# Patient Record
Sex: Male | Born: 1965 | Race: Black or African American | Hispanic: No | Marital: Married | Smoking: Never smoker
Health system: Southern US, Community
[De-identification: ages and names within clinical notes are randomized; demographics above are authoritative.]

## PROBLEM LIST (undated history)

## (undated) DIAGNOSIS — I10 Essential (primary) hypertension: Secondary | ICD-10-CM

## (undated) DIAGNOSIS — Z789 Other specified health status: Secondary | ICD-10-CM

## (undated) HISTORY — DX: Other specified health status: Z78.9

## (undated) HISTORY — DX: Essential (primary) hypertension: I10

## (undated) HISTORY — PX: NO PAST SURGERIES: SHX2092

---

## 2001-11-12 ENCOUNTER — Emergency Department (HOSPITAL_COMMUNITY): Admission: EM | Admit: 2001-11-12 | Discharge: 2001-11-12 | Payer: Self-pay | Admitting: Emergency Medicine

## 2002-06-26 ENCOUNTER — Encounter: Payer: Self-pay | Admitting: Endocrinology

## 2002-06-26 ENCOUNTER — Ambulatory Visit (HOSPITAL_COMMUNITY): Admission: RE | Admit: 2002-06-26 | Discharge: 2002-06-26 | Payer: Self-pay | Admitting: Endocrinology

## 2003-10-31 ENCOUNTER — Emergency Department (HOSPITAL_COMMUNITY): Admission: EM | Admit: 2003-10-31 | Discharge: 2003-10-31 | Payer: Self-pay | Admitting: Emergency Medicine

## 2004-08-20 ENCOUNTER — Emergency Department (HOSPITAL_COMMUNITY): Admission: EM | Admit: 2004-08-20 | Discharge: 2004-08-20 | Payer: Self-pay | Admitting: Emergency Medicine

## 2004-09-08 ENCOUNTER — Ambulatory Visit: Payer: Self-pay | Admitting: Internal Medicine

## 2005-08-19 ENCOUNTER — Emergency Department (HOSPITAL_COMMUNITY): Admission: EM | Admit: 2005-08-19 | Discharge: 2005-08-19 | Payer: Self-pay | Admitting: Emergency Medicine

## 2005-09-03 ENCOUNTER — Ambulatory Visit: Payer: Self-pay | Admitting: Internal Medicine

## 2010-12-29 ENCOUNTER — Other Ambulatory Visit: Payer: Self-pay | Admitting: Specialist

## 2010-12-29 ENCOUNTER — Ambulatory Visit
Admission: RE | Admit: 2010-12-29 | Discharge: 2010-12-29 | Disposition: A | Discharge status: Home / Self Care | Payer: No Typology Code available for payment source | Source: Ambulatory Visit | Attending: Specialist | Admitting: Specialist

## 2010-12-29 DIAGNOSIS — R6889 Other general symptoms and signs: Secondary | ICD-10-CM

## 2010-12-29 DIAGNOSIS — R7612 Nonspecific reaction to cell mediated immunity measurement of gamma interferon antigen response without active tuberculosis: Secondary | ICD-10-CM

## 2012-05-18 ENCOUNTER — Emergency Department (HOSPITAL_COMMUNITY)
Admission: EM | Admit: 2012-05-18 | Discharge: 2012-05-18 | Disposition: A | Discharge status: Home / Self Care | Payer: Self-pay | Attending: Emergency Medicine | Admitting: Emergency Medicine

## 2012-05-18 ENCOUNTER — Encounter (HOSPITAL_COMMUNITY): Payer: Self-pay | Admitting: *Deleted

## 2012-05-18 ENCOUNTER — Emergency Department (HOSPITAL_COMMUNITY): Payer: Self-pay

## 2012-05-18 DIAGNOSIS — R0989 Other specified symptoms and signs involving the circulatory and respiratory systems: Secondary | ICD-10-CM | POA: Insufficient documentation

## 2012-05-18 DIAGNOSIS — R002 Palpitations: Secondary | ICD-10-CM | POA: Insufficient documentation

## 2012-05-18 DIAGNOSIS — R0602 Shortness of breath: Secondary | ICD-10-CM | POA: Insufficient documentation

## 2012-05-18 DIAGNOSIS — R0609 Other forms of dyspnea: Secondary | ICD-10-CM | POA: Insufficient documentation

## 2012-05-18 DIAGNOSIS — R42 Dizziness and giddiness: Secondary | ICD-10-CM | POA: Insufficient documentation

## 2012-05-18 DIAGNOSIS — R079 Chest pain, unspecified: Secondary | ICD-10-CM

## 2012-05-18 LAB — POCT I-STAT TROPONIN I: Troponin i, poc: 0 ng/mL (ref 0.00–0.08)

## 2012-05-18 LAB — BASIC METABOLIC PANEL
Calcium: 9.2 mg/dL (ref 8.4–10.5)
Creatinine, Ser: 0.85 mg/dL (ref 0.50–1.35)
GFR calc non Af Amer: >90 mL/min (ref >90)
Glucose, Bld: 111 mg/dL — ABNORMAL HIGH (ref 70–99)
Sodium: 138 mEq/L (ref 135–145)

## 2012-05-18 LAB — CBC
MCH: 29.1 pg (ref 26.0–34.0)
MCHC: 36.6 g/dL — ABNORMAL HIGH (ref 30.0–36.0)
MCV: 79.6 fL (ref 78.0–100.0)
Platelets: 200 10*3/uL (ref 150–400)

## 2012-05-18 MED ORDER — ASPIRIN 81 MG PO CHEW
162.0000 mg | CHEWABLE_TABLET | Freq: Once | ORAL | Status: AC
  Administered 2012-05-18: 162 mg via ORAL
  Filled 2012-05-18: qty 2

## 2012-05-18 NOTE — ED Notes (Signed)
Patient to xray.

## 2012-05-18 NOTE — ED Provider Notes (Signed)
History     CSN: [id number]  Arrival date & time 05/18/12  0402   First MD Initiated Contact with Patient 05/18/12 0421      Chief Complaint  Patient presents with  . Chest Pain    (Consider location/radiation/quality/duration/timing/severity/associated sxs/prior treatment) HPI Comments: [name] with no PMH presents to the ED with complaints of chest pain. Pt states the pain was 2 sharp jolts of pain that woke him up at 2:30AM this morning. He denies any radiation. He admits to palpitations at night, some SOB and difficulty breathing, and ooccasional dizziness upon waking. On ROS, pt denies fever, chills, HAs, recent wt. loss, N/V, dyspepsia, abd. pain decreased appetite. He has no PMH. Denies alcohol/tobacco/drug use.  Patient is a 47 y.o. male presenting with chest pain. The history is provided by the patient.  Chest Pain Associated symptoms: no abdominal pain, no cough and no shortness of breath     History reviewed. No pertinent past medical history.  History reviewed. No pertinent past surgical history.  History reviewed. No pertinent family history.  History  Substance Use Topics  . Smoking status: Never Smoker   . Smokeless tobacco: Not on file  . Alcohol Use: No      Review of Systems  Constitutional: Negative for activity change and appetite change.  Respiratory: Negative for cough and shortness of breath.   Cardiovascular: Positive for chest pain.  Gastrointestinal: Negative for abdominal pain.  Genitourinary: Negative for dysuria.    Allergies  Review of patient's allergies indicates no known allergies.  Home Medications  No current outpatient prescriptions on file.  BP 132/88  Pulse 55  Temp(Src) 99.4 F (37.4 C) (Oral)  Resp 16  SpO2 100%  Physical Exam  Constitutional: He is oriented to person, place, and time. He appears well-developed.  HENT:  Head: Normocephalic and atraumatic.  Eyes: Conjunctivae and EOM are normal. Pupils are  equal, round, and reactive to light.  Neck: Normal range of motion. Neck supple.  Cardiovascular: Normal rate and regular rhythm.   Pulmonary/Chest: Effort normal and breath sounds normal. No respiratory distress. He has no wheezes. He has no rales.  Abdominal: Soft. Bowel sounds are normal. He exhibits no distension. There is no tenderness. There is no rebound and no guarding.  Neurological: He is alert and oriented to person, place, and time.  Skin: Skin is warm.    ED Course  Procedures (including critical care time)  Labs Reviewed  CBC - Abnormal; Notable for the following:    MCHC 36.6 (*)    All other components within normal limits  BASIC METABOLIC PANEL - Abnormal; Notable for the following:    Potassium 3.3 (*)    Glucose, Bld 111 (*)    All other components within normal limits  POCT I-STAT TROPONIN I   Dg Chest 2 View  05/18/2012  *RADIOLOGY REPORT*  Clinical Data: Intermittent shortness of breath and epigastric discomfort.  CHEST - 2 VIEW  Comparison: Chest radiograph performed 12/29/2010  Findings: The lungs are well-aerated.  Minimal bibasilar opacities may reflect atelectasis or less likely mild pneumonia, slightly more prominent on the right than on prior studies.  No pleural effusion or pneumothorax is seen.  The heart is normal in size; the mediastinal contour is within normal limits.  No acute osseous abnormalities are seen.  IMPRESSION: Minimal bibasilar opacities may reflect atelectasis or less likely mild pneumonia, slightly more prominent on the right than on prior studies.   Original Report Authenticated  By: Tonia Ghent, M.D.      No diagnosis found.    MDM   Date: 05/18/2012  Rate: 74  Rhythm: normal sinus rhythm  QRS Axis: normal  Intervals: normal  ST/T Wave abnormalities: normal  Conduction Disutrbances: none  Narrative Interpretation: unremarkable  Differential diagnosis includes: ACS syndrome CHF exacerbation Valvular  disorder Myocarditis Pericarditis Pericardial effusion Pneumonia Pleural effusion Pulmonary edema PE Anemia Musculoskeletal pain  Pt comes in with atypical chest pain. Pt has been having similar pains over the past 3 months, about 3 episodes so far. He has no cardiac risk factors. Pain is not pleuritic, exertional, musculoskeletal. Will get 2 trops as an ED rule out. Return precautions discussed.    Derwood Kaplan, MD 05/18/12 [phone]

## 2012-05-18 NOTE — ED Notes (Signed)
Pt c/o intermittent "feeling in my chest", pointing to epigastric area.  Also c/o intermittent SOB.  Denies n/v, weakness, dizziness, fevers.  States "It happens after I eat oil"

## 2012-05-18 NOTE — ED Notes (Signed)
Patient returned from xray.

## 2012-05-18 NOTE — ED Notes (Signed)
Patient states, "I have no chest pain.  But sometimes at night when I have eaten something with oil, I hear it.  It feels fluttery.   But no pain".

## 2015-05-28 LAB — GLUCOSE, POCT (MANUAL RESULT ENTRY): POC GLUCOSE: 73 mg/dL (ref 70–99)

## 2017-03-10 ENCOUNTER — Encounter (HOSPITAL_COMMUNITY): Payer: Self-pay | Admitting: Family Medicine

## 2017-03-10 ENCOUNTER — Ambulatory Visit (INDEPENDENT_AMBULATORY_CARE_PROVIDER_SITE_OTHER): Payer: Self-pay

## 2017-03-10 ENCOUNTER — Ambulatory Visit (HOSPITAL_COMMUNITY)
Admission: EM | Admit: 2017-03-10 | Discharge: 2017-03-10 | Disposition: A | Discharge status: Home / Self Care | Payer: Self-pay | Attending: Family Medicine | Admitting: Family Medicine

## 2017-03-10 DIAGNOSIS — M79601 Pain in right arm: Secondary | ICD-10-CM

## 2017-03-10 DIAGNOSIS — S5002XA Contusion of left elbow, initial encounter: Secondary | ICD-10-CM

## 2017-03-10 MED ORDER — DICLOFENAC SODIUM 1 % TD GEL: 2.0000 g | Freq: Two times a day (BID) | TRANSDERMAL | 0 refills | Status: AC | PRN

## 2017-03-10 MED ORDER — NAPROXEN 500 MG PO TABS: 500.0000 mg | ORAL_TABLET | Freq: Two times a day (BID) | ORAL | 0 refills | Status: DC

## 2017-03-10 NOTE — ED Triage Notes (Signed)
Pt here for left elbow pain for 2 weeks. Reports that he hit it on the wall. Using OTC meds without relief.

## 2017-03-10 NOTE — ED Provider Notes (Signed)
[facility]    CSN: [id number] Arrival date & time: 03/10/17  1344     History   Chief Complaint Chief Complaint  Patient presents with  . Arm Pain    HPI Jonathan Lyons is a 52 y.o. male.   52 year-old male, presenting today complaining of left elbow pain.  States that he hit his left lateral elbow on a wall about 2 weeks ago.  Has been using topical medications at home without much relief.  Pain with flexion of the elbow as well as pronation and supination.  Patient is right-handed   The history is provided by the patient.  Arm Pain  This is a new problem. The current episode started more than 1 week ago. The problem occurs constantly. The problem has not changed since onset.Pertinent negatives include no chest pain, no abdominal pain, no headaches and no shortness of breath. The symptoms are aggravated by twisting (movement of the left elbow). Nothing relieves the symptoms. He has tried nothing for the symptoms. The treatment provided no relief.    History reviewed. No pertinent past medical history.  There are no active problems to display for this patient.   History reviewed. No pertinent surgical history.     Home Medications    Prior to Admission medications   Medication Sig Start Date End Date Taking? Authorizing Provider  diclofenac sodium (VOLTAREN) 1 % GEL Apply 2 g topically 2 (two) times daily as needed for up to 5 days (elbow pain). 03/10/17 03/15/17  Blue, Olivia C, PA-C  naproxen (NAPROSYN) 500 MG tablet Take 1 tablet (500 mg total) by mouth 2 (two) times daily. 03/10/17   Blue, Marylene Land, PA-C    Family History History reviewed. No pertinent family history.  Social History Social History   Tobacco Use  . Smoking status: Never Smoker  Substance Use Topics  . Alcohol use: No  . Drug use: No     Allergies   Patient has no known allergies.   Review of Systems Review of Systems  Constitutional: Negative for chills and fever.  HENT:  Negative for ear pain and sore throat.   Eyes: Negative for pain and visual disturbance.  Respiratory: Negative for cough and shortness of breath.   Cardiovascular: Negative for chest pain and palpitations.  Gastrointestinal: Negative for abdominal pain and vomiting.  Genitourinary: Negative for dysuria and hematuria.  Musculoskeletal: Positive for joint swelling (left elbow ). Negative for arthralgias and back pain.  Skin: Negative for color change and rash.  Neurological: Negative for seizures, syncope and headaches.  All other systems reviewed and are negative.    Physical Exam Triage Vital Signs ED Triage Vitals [03/10/17 1518]  Enc Vitals Group     BP 135/85     Pulse Rate (!) 53     Resp 18     Temp 97.8 F (36.6 C)     Temp src      SpO2 100 %     Weight      Height      Head Circumference      Peak Flow      Pain Score 7     Pain Loc      Pain Edu?      Excl. in GC?    No data found.  Updated Vital Signs BP 135/85   Pulse (!) 53   Temp 97.8 F (36.6 C)   Resp 18   SpO2 100%   Visual Acuity Right Eye  Distance:   Left Eye Distance:   Bilateral Distance:    Right Eye Near:   Left Eye Near:    Bilateral Near:     Physical Exam  Constitutional: He appears well-developed and well-nourished.  HENT:  Head: Normocephalic and atraumatic.  Eyes: Conjunctivae are normal.  Neck: Neck supple.  Cardiovascular: Normal rate and regular rhythm.  No murmur heard. Pulmonary/Chest: Effort normal and breath sounds normal. No respiratory distress.  Abdominal: Soft. There is no tenderness.  Musculoskeletal: He exhibits no edema.       Left elbow: Tenderness found. Lateral epicondyle tenderness noted.  Neurological: He is alert.  Skin: Skin is warm and dry.  Psychiatric: He has a normal mood and affect.  Nursing note and vitals reviewed.    UC Treatments / Results  Labs (all labs ordered are listed, but only abnormal results are displayed) Labs Reviewed - No  data to display  EKG  EKG Interpretation None       Radiology Dg Elbow Complete Left  Result Date: 03/10/2017 CLINICAL DATA:  Status post trauma 2 weeks ago with pain of the left elbow. EXAM: LEFT ELBOW - COMPLETE 3+ VIEW COMPARISON:  None. FINDINGS: There is no evidence of fracture, dislocation, or joint effusion. Soft tissues are unremarkable. IMPRESSION: No acute fracture or dislocation. Electronically Signed   By: Sherian ReinWei-Chen  Lin M.D.   On: 03/10/2017 15:45    Procedures Procedures (including critical care time)  Medications Ordered in UC Medications - No data to display   Initial Impression / Assessment and Plan / UC Course  I have reviewed the triage vital signs and the nursing notes.  Pertinent labs & imaging results that were available during my care of the patient were reviewed by me and considered in my medical decision making (see chart for details).     X-ray.  Patient likely has a contusion with secondary inflammation.  Given anti-inflammatory as well as Voltaren gel  Final Clinical Impressions(s) / UC Diagnoses   Final diagnoses:  Contusion of left elbow, initial encounter    ED Discharge Orders        Ordered    naproxen (NAPROSYN) 500 MG tablet  2 times daily     03/10/17 1555    diclofenac sodium (VOLTAREN) 1 % GEL  2 times daily PRN     03/10/17 1555       Controlled Substance Prescriptions [place] Controlled Substance Registry consulted? Not Applicable   Alecia LemmingBlue, Olivia C, [place]PA-C 03/10/17 1701

## 2017-07-31 ENCOUNTER — Ambulatory Visit (HOSPITAL_COMMUNITY)
Admission: EM | Admit: 2017-07-31 | Discharge: 2017-07-31 | Disposition: A | Discharge status: Home / Self Care | Payer: Self-pay | Attending: Family Medicine | Admitting: Family Medicine

## 2017-07-31 ENCOUNTER — Encounter (HOSPITAL_COMMUNITY): Payer: Self-pay | Admitting: Emergency Medicine

## 2017-07-31 DIAGNOSIS — R600 Localized edema: Secondary | ICD-10-CM

## 2017-07-31 DIAGNOSIS — I10 Essential (primary) hypertension: Secondary | ICD-10-CM

## 2017-07-31 MED ORDER — HYDROCHLOROTHIAZIDE 25 MG PO TABS: 25.0000 mg | ORAL_TABLET | Freq: Every day | ORAL | 0 refills | Status: DC

## 2017-07-31 NOTE — ED Triage Notes (Signed)
Pt c/o LLQ abdomianl pain and feet swelling

## 2017-07-31 NOTE — ED Provider Notes (Signed)
[facility]    CSN: [id number] Arrival date & time: 07/31/17  1857     History   Chief Complaint Chief Complaint  Patient presents with  . Abdominal Pain  . Foot Swelling    HPI Jonathan Lyons is a 52 y.o. male.   HPI  Patient does not have a regular primary care doctor.  He does not get any regular medical care.  He states that he has heartburn and takes Zantac over-the-counter on a regular basis.  Certain foods bother him more than others.  He states that a couple days ago he had some left lower quadrant abdominal pain.  He states that it was significantly bothering him, and then went away.  He thought that it was from "bad food".  [place]  His English is good.  The main reason he came in today is that he has ankle swelling.  Both of his ankles have been swollen for the last 2 days.  No chest pain.  He states he has shortness of breath "when I run".  He states this is unchanged.  He is on a regular diet and states that he does not eat a lot of salt.  No change in his diet.  No over-the-counter medicines other than the Zantac.  History reviewed. No pertinent past medical history.  There are no active problems to display for this patient.   History reviewed. No pertinent surgical history.     Home Medications    Prior to Admission medications   Medication Sig Start Date End Date Taking? Authorizing Provider  hydrochlorothiazide (HYDRODIURIL) 25 MG tablet Take 1 tablet (25 mg total) by mouth daily. 07/31/17   Eustace MooreNelson, Yvonne Sue, MD    Family History No family history on file. Patient specifically states no family history of hypertension or heart disease is known Social History Social History   Tobacco Use  . Smoking status: Never Smoker  Substance Use Topics  . Alcohol use: No  . Drug use: No     Allergies   Patient has no known allergies.   Review of Systems Review of Systems    Constitutional: Negative for chills and fever.  HENT: Negative for ear pain and sore throat.   Eyes: Negative for pain and visual disturbance.  Respiratory: Positive for shortness of breath. Negative for cough.        Sometimes with exercise  Cardiovascular: Positive for leg swelling. Negative for chest pain and palpitations.  Gastrointestinal: Negative for abdominal pain and vomiting.  Genitourinary: Negative for dysuria and hematuria.  Musculoskeletal: Negative for arthralgias and back pain.  Skin: Negative for color change and rash.  Neurological: Negative for seizures and syncope.  All other systems reviewed and are negative.    Physical Exam Triage Vital Signs ED Triage Vitals [07/31/17 1940]  Enc Vitals Group     BP (!) 173/86     Pulse Rate 65     Resp 18     Temp 97.8 F (36.6 C)     Temp src      SpO2 99 %     Updated Vital Signs BP (!) 173/86   Pulse 65   Temp 97.8 F (36.6 C)   Resp 18   SpO2 99%   Repeat blood pressure, supine, 154/90    Physical Exam  Constitutional: He is oriented to person, place, and time. He appears well-developed and well-nourished. No distress.  HENT:  Head: Normocephalic and atraumatic.  Mouth/Throat: Oropharynx is clear and moist.  Eyes: Pupils are equal, round, and reactive to light. Conjunctivae are normal.  Disks flat  Neck: Normal range of motion.  Cardiovascular: Normal rate, regular rhythm and normal heart sounds.  Pulmonary/Chest: Effort normal and breath sounds normal. No respiratory distress. He has no rales.  Abdominal: Soft. Bowel sounds are normal. He exhibits no distension. There is no hepatosplenomegaly. There is no tenderness.  Musculoskeletal: Normal range of motion. He exhibits no edema.  Neurological: He is alert and oriented to person, place, and time.  Skin: Skin is warm and dry.  Psychiatric: He has a normal mood and affect. His behavior is normal.     UC Treatments / Results  Labs (all labs  ordered are listed, but only abnormal results are displayed) Labs Reviewed - No data to display  EKG EKG with early LVH.  Possible old septal infarct (doubt).  Discussed with patient  Radiology No results found.  Procedures Procedures (including critical care time)  Medications Ordered in UC Medications - No data to display  Initial Impression / Assessment and Plan / UC Course  I have reviewed the triage vital signs and the nursing notes.  Pertinent labs & imaging results that were available during my care of the patient were reviewed by me and considered in my medical decision making (see chart for details).     Patient has untreated hypertension for, unclear period of time.  He does have some early LVH.  This may be contributing to his pedal edema.  We discussed blood pressure management, follow-up with the PCP.  Avoiding salt. He should go to the emergency room if he develops chest pain, dyspnea on exertion, increasing edema Final Clinical Impressions(s) / UC Diagnoses   Final diagnoses:  Pedal edema  Essential hypertension     Discharge Instructions     Take your blood pressure pill every day. Limit salty food in your diet. Elevate your feet when they are swollen. Follow-up with a primary care doctor. Follow the instructions on your paperwork to obtain up assistance through [facility]     ED Prescriptions    Medication Sig Dispense Auth. Provider   hydrochlorothiazide (HYDRODIURIL) 25 MG tablet Take 1 tablet (25 mg total) by mouth daily. 90 tablet Eustace Moore, MD     Controlled Substance Prescriptions [place] Controlled Substance Registry consulted? Not Applicable   Eustace Moore, MD 07/31/17 2123

## 2017-07-31 NOTE — Discharge Instructions (Addendum)
Take your blood pressure pill every day. Limit salty food in your diet. Elevate your feet when they are swollen. Follow-up with a primary care doctor. Follow the instructions on your paperwork to obtain [facility] assistance [facility]

## 2017-09-12 ENCOUNTER — Ambulatory Visit (HOSPITAL_COMMUNITY)
Admission: EM | Admit: 2017-09-12 | Discharge: 2017-09-12 | Disposition: A | Discharge status: Home / Self Care | Payer: Self-pay | Attending: Family Medicine | Admitting: Family Medicine

## 2017-09-12 ENCOUNTER — Encounter (HOSPITAL_COMMUNITY): Payer: Self-pay | Admitting: Emergency Medicine

## 2017-09-12 DIAGNOSIS — I10 Essential (primary) hypertension: Secondary | ICD-10-CM | POA: Diagnosis present

## 2017-09-12 DIAGNOSIS — E876 Hypokalemia: Secondary | ICD-10-CM

## 2017-09-12 HISTORY — DX: Essential (primary) hypertension: I10

## 2017-09-12 LAB — POCT I-STAT, CHEM 8
BUN: 17 mg/dL (ref 6–20)
Calcium, Ion: 1.16 mmol/L (ref 1.15–1.40)
Chloride: 100 mmol/L (ref 98–111)
Creatinine, Ser: 1 mg/dL (ref 0.61–1.24)
Glucose, Bld: 95 mg/dL (ref 70–99)
HCT: 42 % (ref 39.0–52.0)
Hemoglobin: 14.3 g/dL (ref 13.0–17.0)
Potassium: 3.3 mmol/L — ABNORMAL LOW (ref 3.5–5.1)
Sodium: 142 mmol/L (ref 135–145)
TCO2: 28 mmol/L (ref 22–32)

## 2017-09-12 MED ORDER — HYDROCHLOROTHIAZIDE 25 MG PO TABS: 25.0000 mg | ORAL_TABLET | Freq: Every day | ORAL | 0 refills | Status: DC

## 2017-09-12 MED ORDER — POTASSIUM CHLORIDE CRYS ER 20 MEQ PO TBCR: 20.0000 meq | EXTENDED_RELEASE_TABLET | Freq: Every day | ORAL | 0 refills | Status: DC

## 2017-09-12 NOTE — ED Triage Notes (Signed)
Pt sts right arm pain and left leg pain; pt sts some swelling but denies injury

## 2017-09-12 NOTE — Discharge Instructions (Signed)
Your potassium is a little bit low.  This is because of the blood pressure pill.  You should increase the potassium foods in your diet.  You may need to take a potassium supplement.  I have sent a refill of your blood pressure medicine to the pharmacy.  Follow-up with primary care as scheduled

## 2017-09-12 NOTE — ED Provider Notes (Signed)
[facility]    CSN: [id number] Arrival date & time: 09/12/17  1059     History   Chief Complaint Chief Complaint  Patient presents with  . Leg Pain  . Arm Pain    HPI Jonathan Lyons is a 52 y.o. male.   HPI  Patient is here complaining of swelling in his right arm and in his left leg.  He points to his forearm on the right just below the medial epicondyle and mentions that this area looks swollen to him.  With wrist flexion, this tenses with the muscles.  I pointed out to him that it is muscular.  He asks why it is swollen.  I believe it is hypertrophic because he has work activity where he uses his hands and arm.  He states there is a pocket of swelling also on his left lateral thigh.  This is not present today for me to examine.  He has no pedal edema.  He is taking his blood pressure medication daily.  Blood pressure today is well controlled.  He states he needs a refill blood pressure medicine.  He states he has found a PCP to see next week. Denies any pain He denies any chest pain shortness of breath, no headaches or dizzy spells  History reviewed. No pertinent past medical history.  Patient Active Problem List   Diagnosis Date Noted  . Essential hypertension 09/12/2017    History reviewed. No pertinent surgical history.     Home Medications    Prior to Admission medications   Medication Sig Start Date End Date Taking? Authorizing Provider  hydrochlorothiazide (HYDRODIURIL) 25 MG tablet Take 1 tablet (25 mg total) by mouth daily. 09/12/17   Eustace MooreNelson, Channa Hazelett Sue, MD  potassium chloride SA (K-DUR,KLOR-CON) 20 MEQ tablet Take 1 tablet (20 mEq total) by mouth daily. 09/12/17   Eustace MooreNelson, Lecil Tapp Sue, MD    Family History History reviewed. No pertinent family history.  Social History Social History   Tobacco Use  . Smoking status: Never Smoker  Substance Use Topics  . Alcohol use: No  . Drug use: No     Allergies   Patient has no known  allergies.   Review of Systems Review of Systems  Constitutional: Negative for chills and fever.  HENT: Negative for ear pain and sore throat.   Eyes: Negative for pain and visual disturbance.  Respiratory: Negative for cough and shortness of breath.   Cardiovascular: Negative for chest pain and palpitations.  Gastrointestinal: Negative for abdominal pain and vomiting.  Genitourinary: Negative for dysuria and hematuria.  Musculoskeletal: Negative for arthralgias and back pain.  Skin: Negative for color change and rash.  Neurological: Negative for seizures and syncope.  All other systems reviewed and are negative.    Physical Exam Triage Vital Signs ED Triage Vitals [09/12/17 1129]  Enc Vitals Group     BP 134/70     Pulse Rate 66     Resp 18     Temp 98.3 F (36.8 C)     Temp Source Oral     SpO2 100 %   No data found.  Updated Vital Signs BP 134/70 (BP Location: Left Arm)   Pulse 66   Temp 98.3 F (36.8 C) (Oral)   Resp 18   SpO2 100%      Physical Exam  Constitutional: He appears well-developed and well-nourished. No distress.  HENT:  Head: Normocephalic and atraumatic.  Mouth/Throat: Oropharynx is clear and moist.  Eyes: Pupils are  equal, round, and reactive to light. Conjunctivae are normal.  Neck: Normal range of motion.  Cardiovascular: Normal rate, regular rhythm and normal heart sounds.  Pulmonary/Chest: Effort normal and breath sounds normal. No respiratory distress. He has no rales.  Abdominal: Soft. He exhibits no distension.  Musculoskeletal: Normal range of motion. He exhibits no edema.  Lymphadenopathy:    He has no cervical adenopathy.  Neurological: He is alert.  Skin: Skin is warm and dry.     UC Treatments / Results  Labs (all labs ordered are listed, but only abnormal results are displayed) Labs Reviewed  POCT I-STAT, CHEM 8 - Abnormal; Notable for the following components:      Result Value   Potassium 3.3 (*)    All other  components within normal limits    EKG None  Radiology No results found.  Procedures Procedures (including critical care time)  Medications Ordered in UC Medications - No data to display  Initial Impression / Assessment and Plan / UC Course  I have reviewed the triage vital signs and the nursing notes.  Pertinent labs & imaging results that were available during my care of the patient were reviewed by me and considered in my medical decision making (see chart for details).  Clinical Course as of Sep 12 1798  Mon Sep 12, 2017  1216 I-Stat, Chem 8 [YN]    Clinical Course User Index [YN] Eustace MooreNelson, Leyton Magoon Sue, MD     Final Clinical Impressions(s) / UC Diagnoses   Final diagnoses:  Hypokalemia     Discharge Instructions     Your potassium is a little bit low.  This is because of the blood pressure pill.  You should increase the potassium foods in your diet.  You may need to take a potassium supplement.  I have sent a refill of your blood pressure medicine to the pharmacy.  Follow-up with primary care as scheduled    ED Prescriptions    Medication Sig Dispense Auth. Provider   hydrochlorothiazide (HYDRODIURIL) 25 MG tablet Take 1 tablet (25 mg total) by mouth daily. 90 tablet Eustace MooreNelson, Francia Verry Sue, MD   potassium chloride SA (K-DUR,KLOR-CON) 20 MEQ tablet Take 1 tablet (20 mEq total) by mouth daily. 90 tablet Eustace MooreNelson, Jarmar Rousseau Sue, MD     Controlled Substance Prescriptions [place] Controlled Substance Registry consulted? Not Applicable   Eustace MooreNelson, Evie Croston Sue, MD 09/12/17 1800

## 2018-03-13 ENCOUNTER — Encounter: Payer: Self-pay | Admitting: Internal Medicine

## 2018-04-07 ENCOUNTER — Ambulatory Visit: Payer: Self-pay | Admitting: Internal Medicine

## 2018-05-01 ENCOUNTER — Telehealth: Payer: Self-pay

## 2018-05-01 NOTE — Telephone Encounter (Signed)
Covid-19 travel screening questions  Have you traveled in the last 14 days? NO If yes where?  Do you now or have you had a fever in the last 14 days? NO  Do you have any respiratory symptoms of shortness of breath or cough now or in the last 14 days? NO  Do you have a medical history of Congestive Heart Failure? NO  Do you have a medical history of lung disease? NO  Do you have any family members or close contacts with diagnosed or suspected Covid-19? NO        

## 2018-05-02 ENCOUNTER — Ambulatory Visit (INDEPENDENT_AMBULATORY_CARE_PROVIDER_SITE_OTHER): Payer: PRIVATE HEALTH INSURANCE | Admitting: Internal Medicine

## 2018-05-02 ENCOUNTER — Other Ambulatory Visit (INDEPENDENT_AMBULATORY_CARE_PROVIDER_SITE_OTHER): Payer: Self-pay

## 2018-05-02 ENCOUNTER — Encounter: Payer: Self-pay | Admitting: Internal Medicine

## 2018-05-02 ENCOUNTER — Other Ambulatory Visit: Payer: Self-pay

## 2018-05-02 VITALS — BP 138/78 | HR 61 | Temp 97.9°F | Ht 70.0 in | Wt 190.0 lb

## 2018-05-02 DIAGNOSIS — R1033 Periumbilical pain: Secondary | ICD-10-CM

## 2018-05-02 LAB — COMPREHENSIVE METABOLIC PANEL
ALBUMIN: 4.2 g/dL (ref 3.5–5.2)
ALT: 13 U/L (ref 0–53)
AST: 16 U/L (ref 0–37)
Alkaline Phosphatase: 70 U/L (ref 39–117)
BUN: 12 mg/dL (ref 6–23)
CALCIUM: 8.8 mg/dL (ref 8.4–10.5)
CHLORIDE: 105 meq/L (ref 96–112)
CO2: 28 mEq/L (ref 19–32)
Creatinine, Ser: 0.8 mg/dL (ref 0.40–1.50)
GFR: 121.78 mL/min (ref >60.00)
Glucose, Bld: 84 mg/dL (ref 70–99)
POTASSIUM: 4.1 meq/L (ref 3.5–5.1)
SODIUM: 139 meq/L (ref 135–145)
Total Bilirubin: 0.5 mg/dL (ref 0.2–1.2)
Total Protein: 7.3 g/dL (ref 6.0–8.3)

## 2018-05-02 LAB — CBC WITH DIFFERENTIAL/PLATELET
BASOS PCT: 1.3 % (ref 0.0–3.0)
Basophils Absolute: 0.1 10*3/uL (ref 0.0–0.1)
EOS PCT: 1.9 % (ref 0.0–5.0)
Eosinophils Absolute: 0.1 10*3/uL (ref 0.0–0.7)
HEMATOCRIT: 44.1 % (ref 39.0–52.0)
HEMOGLOBIN: 15 g/dL (ref 13.0–17.0)
Lymphocytes Relative: 42.2 % (ref 12.0–46.0)
Lymphs Abs: 1.7 10*3/uL (ref 0.7–4.0)
MCHC: 34.1 g/dL (ref 30.0–36.0)
MCV: 83.8 fl (ref 78.0–100.0)
MONOS PCT: 10.8 % (ref 3.0–12.0)
Monocytes Absolute: 0.4 10*3/uL (ref 0.1–1.0)
Neutro Abs: 1.8 10*3/uL (ref 1.4–7.7)
Neutrophils Relative %: 43.8 % (ref 43.0–77.0)
Platelets: 207 10*3/uL (ref 150.0–400.0)
RBC: 5.26 Mil/uL (ref 4.22–5.81)
RDW: 13.5 % (ref 11.5–15.5)
WBC: 4.1 10*3/uL (ref 4.0–10.5)

## 2018-05-02 LAB — LIPASE: Lipase: 6 U/L — ABNORMAL LOW (ref 11.0–59.0)

## 2018-05-02 LAB — AMYLASE: Amylase: 91 U/L (ref 27–131)

## 2018-05-02 NOTE — Progress Notes (Signed)
   Jule Lorey 53 y.o. May 30, 1965 [id number]  Assessment & Plan:   Encounter Diagnosis  Name Primary?  . Periumbilical abdominal pain Yes   Cause not clear but seems benign. Response to H2 blocker suggests acid component but periumbilical pain unudual location (? Referred) Switch prn Zantac to Pepcid Continue dietary changes Evaluatte with labs as below - ? Dyspepsia, H pylori gastritis, pancreas problem less likely Orders Placed This Encounter  Procedures  . CBC with Differential/Platelet  . Comprehensive metabolic panel  . Amylase  . Lipase  . H. pylori antigen, stool    Subjective:   Chief Complaint: abdominal pain  HPI 53 yo man from [place] with 7 month hx of intermittent periumbilical pain that seems to occur after drinking or eating sweets or eating oily foods. There is often an associated headache.  Zantac 75 will relieve his sxs. No unintentional weight loss. No diarrhea or constipation. Occurs a few times a month but not if he avoids the trigger foods. No unintentional weight loss, nausea, vomiting or heartburn.  He was seen in urgent care in 2019 - had pedal edema and elevated BP and was Rxed diuretics and BP meds but he stopped them. EKG w/ LVH Hgb and BMET was NL No Known Allergies Zantac 75 prn Past Medical History:  Diagnosis Date  . Hypertension    Past Surgical History:  Procedure Laterality Date  . NO PAST SURGERIES     Social History   Social History Narrative   Separated - 5 grown children 1 daughter 4 sons   Company secretary    No EtOH, tobacco or drugs   Emigrated from [place] decades ahgo   Family history negative for GI problems  Review of Systems Pedal edema All other ROS negative Objective:   Physical Exam @BP  138/78   Pulse 61   Temp 97.9 F (36.6 C)   Ht 5\' 10"  (1.778 m) Comment: without shoes  Wt 190 lb (86.2 kg)   BMI 27.26 kg/m @  General:  Well-developed, well-nourished and in no acute distress Eyes:  anicteric.  ENT:   Mouth and posterior pharynx free of lesions.  Neck:   supple w/o thyromegaly or mass.  Lungs: Clear to auscultation bilaterally. Heart:  S1S2, no rubs, murmurs, gallops. Abdomen:  soft, non-tender, no hepatosplenomegaly, hernia, or mass and BS+.  Lymph:  no cervical or supraclavicular adenopathy. Extremities:   no edema, cyanosis or clubbing Skin   no rash. Neuro:  A&O x 3.  Psych:  appropriate mood and  Affect.   Data Reviewed:  2019 ED/UC nots - had edema and high blood pressure - was treated but he stopped meds - says no more problems

## 2018-05-02 NOTE — Patient Instructions (Signed)
Your provider has requested that you go to the basement level for lab work before leaving today. Press "B" on the elevator. The lab is located at the first door on the left as you exit the elevator.   Purchase over the counter Pepcid 20mg  and use as needed in place of the zantac you have.   I appreciate the opportunity to care for you. Stan Head, MD, [facility]

## 2018-05-04 LAB — H. PYLORI ANTIGEN, STOOL: H pylori Ag, Stl: NEGATIVE

## 2018-05-04 NOTE — Progress Notes (Signed)
Let him know labs are all ok and no stomach infection  He should continue diet modificatioin (avoid triggers) and take pepcid prn and see me prn  I do recommend a screening colonoscopy later this year so advise him of that and place a recall for August

## 2018-07-25 ENCOUNTER — Ambulatory Visit (HOSPITAL_COMMUNITY)
Admission: EM | Admit: 2018-07-25 | Discharge: 2018-07-25 | Disposition: A | Discharge status: Home / Self Care | Payer: PRIVATE HEALTH INSURANCE | Attending: Family Medicine | Admitting: Family Medicine

## 2018-07-25 ENCOUNTER — Other Ambulatory Visit: Payer: Self-pay

## 2018-07-25 ENCOUNTER — Encounter (HOSPITAL_COMMUNITY): Payer: Self-pay | Admitting: Family Medicine

## 2018-07-25 DIAGNOSIS — G4489 Other headache syndrome: Secondary | ICD-10-CM | POA: Insufficient documentation

## 2018-07-25 LAB — COMPREHENSIVE METABOLIC PANEL
ALT: 18 U/L (ref 0–44)
AST: 21 U/L (ref 15–41)
Albumin: 4 g/dL (ref 3.5–5.0)
Alkaline Phosphatase: 65 U/L (ref 38–126)
Anion gap: 8 (ref 5–15)
BUN: 10 mg/dL (ref 6–20)
CO2: 25 mmol/L (ref 22–32)
Calcium: 9.1 mg/dL (ref 8.9–10.3)
Chloride: 106 mmol/L (ref 98–111)
Creatinine, Ser: 1.14 mg/dL (ref 0.61–1.24)
GFR calc Af Amer: >60 mL/min (ref >60)
GFR calc non Af Amer: >60 mL/min (ref >60)
Glucose, Bld: 81 mg/dL (ref 70–99)
Potassium: 4 mmol/L (ref 3.5–5.1)
Sodium: 139 mmol/L (ref 135–145)
Total Bilirubin: 0.6 mg/dL (ref 0.3–1.2)
Total Protein: 6.9 g/dL (ref 6.5–8.1)

## 2018-07-25 LAB — CBC
HCT: 41.6 % (ref 39.0–52.0)
Hemoglobin: 14.1 g/dL (ref 13.0–17.0)
MCH: 28.3 pg (ref 26.0–34.0)
MCHC: 33.9 g/dL (ref 30.0–36.0)
MCV: 83.5 fL (ref 80.0–100.0)
Platelets: 233 10*3/uL (ref 150–400)
RBC: 4.98 MIL/uL (ref 4.22–5.81)
RDW: 12.4 % (ref 11.5–15.5)
WBC: 5.3 10*3/uL (ref 4.0–10.5)
nRBC: 0 % (ref 0.0–0.2)

## 2018-07-25 LAB — POCT URINALYSIS DIP (DEVICE)
Bilirubin Urine: NEGATIVE
Glucose, UA: NEGATIVE mg/dL
Hgb urine dipstick: NEGATIVE
Ketones, ur: NEGATIVE mg/dL
Leukocytes,Ua: NEGATIVE
Nitrite: NEGATIVE
Protein, ur: NEGATIVE mg/dL
Specific Gravity, Urine: 1.025 (ref 1.005–1.030)
Urobilinogen, UA: 1 mg/dL (ref 0.0–1.0)
pH: 8.5 — ABNORMAL HIGH (ref 5.0–8.0)

## 2018-07-25 NOTE — ED Triage Notes (Signed)
Pt sts pain in head x 3 days with some dizziness

## 2018-07-25 NOTE — ED Provider Notes (Signed)
[facility]    CSN: [id number] Arrival date & time: 07/25/18  1905     History   Chief Complaint Chief Complaint  Patient presents with  . Headache    HPI Jonathan Lyons is a 53 y.o. male.   53 yo man, originally from [place], with dizziness and headache.  He is an established [facility] patient.  About a week ago the patient started having occipital pain that lasted about 2 seconds and was followed by some dizziness.  This happened several times a day and is worse after eating.  Does not matter what time of day.  It does not cause any other neurological symptoms.  Patient works at [employer].     Past Medical History:  Diagnosis Date  . Hypertension     Patient Active Problem List   Diagnosis Date Noted  . Essential hypertension 09/12/2017    Past Surgical History:  Procedure Laterality Date  . NO PAST SURGERIES         Home Medications    Prior to Admission medications   Medication Sig Start Date End Date Taking? Authorizing Provider  famotidine (PEPCID) 20 MG tablet Take 20 mg by mouth daily as needed for heartburn or indigestion.    [provider]    Family History Family History  Problem Relation Age of Onset  . Colon cancer Neg Hx     Social History Social History   Tobacco Use  . Smoking status: Never Smoker  . Smokeless tobacco: Never Used  Substance Use Topics  . Alcohol use: No  . Drug use: No     Allergies   Patient has no known allergies.   Review of Systems Review of Systems  Constitutional: Negative.   HENT: Negative.  Negative for hearing loss.   Respiratory: Negative.   Gastrointestinal: Negative.   Genitourinary: Positive for flank pain.  Neurological: Positive for dizziness and headaches.  All other systems reviewed and are negative.    Physical Exam Triage Vital Signs ED Triage Vitals  Enc Vitals Group     BP      Pulse      Resp      Temp      Temp src      SpO2      Weight      Height       Head Circumference      Peak Flow      Pain Score      Pain Loc      Pain Edu?      Excl. in [place]?    No data found.  Updated Vital Signs BP (!) 152/82 (BP Location: Right Arm)   Pulse (!) 57   Temp 98.2 F (36.8 C) (Oral)   Resp 18   SpO2 99%    Physical Exam Vitals signs and nursing note reviewed.  Constitutional:      Appearance: He is well-developed.  HENT:     Mouth/Throat:     Mouth: Mucous membranes are moist.  Eyes:     Extraocular Movements: Extraocular movements intact.     Pupils: Pupils are equal, round, and reactive to light.  Neck:     Musculoskeletal: Normal range of motion and neck supple.  Cardiovascular:     Rate and Rhythm: Normal rate.     Heart sounds: Normal heart sounds.  Pulmonary:     Effort: Pulmonary effort is normal.     Breath sounds: Normal breath sounds.  Neurological:  Mental Status: He is alert.     Cranial Nerves: No cranial nerve deficit.     Sensory: No sensory deficit.     Motor: No weakness.     Coordination: Romberg sign negative. Coordination normal.  Psychiatric:        Mood and Affect: Mood normal.        Speech: Speech normal.        Behavior: Behavior normal.      UC Treatments / Results  Labs (all labs ordered are listed, but only abnormal results are displayed) Labs Reviewed  COMPREHENSIVE METABOLIC PANEL  CBC    EKG None  Radiology No results found.  Procedures Procedures (including critical care time)  Medications Ordered in UC Medications - No data to display  Initial Impression / Assessment and Plan / UC Course  I have reviewed the triage vital signs and the nursing notes.  Pertinent labs & imaging results that were available during my care of the patient were reviewed by me and considered in my medical decision making (see chart for details).    Final Clinical Impressions(s) / UC Diagnoses   Final diagnoses:  Other headache syndrome     Discharge Instructions     If you have  not heard from us about the blood test by 5:00 tomorrow, either return or call us at [phone]    ED Prescriptions    None     Controlled Substance Prescriptions [place] Controlled Substance Registry consulted? Not Applicable   Elvina SidleLauenstein, Renate Danh, MD 07/25/18 1949

## 2018-07-25 NOTE — Discharge Instructions (Addendum)
If you have not heard from [place] about the blood test by 5:00 tomorrow, either return or call us at [phone]

## 2018-07-28 ENCOUNTER — Telehealth (HOSPITAL_COMMUNITY): Payer: Self-pay | Admitting: Emergency Medicine

## 2018-07-28 NOTE — Telephone Encounter (Signed)
Patient contacted and made aware of all results, all questions answered.  Will follow up with PCP

## 2018-08-31 ENCOUNTER — Encounter: Payer: Self-pay | Admitting: Internal Medicine

## 2018-09-12 ENCOUNTER — Encounter: Payer: Self-pay | Admitting: Internal Medicine

## 2018-09-27 ENCOUNTER — Emergency Department (HOSPITAL_COMMUNITY): Payer: No Typology Code available for payment source

## 2018-09-27 ENCOUNTER — Emergency Department (HOSPITAL_COMMUNITY)
Admission: EM | Admit: 2018-09-27 | Discharge: 2018-09-27 | Disposition: A | Discharge status: Home / Self Care | Payer: No Typology Code available for payment source | Attending: Emergency Medicine | Admitting: Emergency Medicine

## 2018-09-27 ENCOUNTER — Other Ambulatory Visit: Payer: Self-pay

## 2018-09-27 ENCOUNTER — Encounter (HOSPITAL_COMMUNITY): Payer: Self-pay

## 2018-09-27 DIAGNOSIS — M545 Low back pain, unspecified: Secondary | ICD-10-CM

## 2018-09-27 DIAGNOSIS — I10 Essential (primary) hypertension: Secondary | ICD-10-CM | POA: Insufficient documentation

## 2018-09-27 DIAGNOSIS — M542 Cervicalgia: Secondary | ICD-10-CM | POA: Diagnosis present

## 2018-09-27 DIAGNOSIS — Z79899 Other long term (current) drug therapy: Secondary | ICD-10-CM | POA: Insufficient documentation

## 2018-09-27 MED ORDER — ACETAMINOPHEN 325 MG PO TABS
650.0000 mg | ORAL_TABLET | Freq: Once | ORAL | Status: AC
  Administered 2018-09-27: 19:00:00 650 mg via ORAL
  Filled 2018-09-27: qty 2

## 2018-09-27 NOTE — ED Notes (Signed)
Pt transported to radiology.
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
## 2018-09-27 NOTE — Discharge Instructions (Addendum)
Your CT scan of your neck today showed some abnormalities and it was recommended that you obtain further imaging with an MRI.  I feel that you will need to get this completed as an outpatient.  Results of your CT scan are as follows:  1. No acute cervical spine fracture.  2. Multilevel degenerative changes of the cervical spine.  3. Scattered lucent heterogeneity within the marrow cavity throughout the cervical vertebrae. Appearance could be seen in the setting of smoking related marrow changes or anemic processes. Marrow replacing disorders could also provide a similar appearance.   You were given information to follow-up with a regular doctor.  Call the office to schedule an appointment for follow-up so that you can get your MRI scheduled.  Please bring this discharge summary to your appointment.    You may rotate taking Tylenol and Motrin for your muscle pain.  Please return to the emergency department for any new or worsening symptoms in the meantime.

## 2018-09-27 NOTE — ED Notes (Signed)
Pt verbalized discharge instructions and follow up care. Alert and ambulatory. No IV. No further questions at this time.  

## 2018-10-10 ENCOUNTER — Ambulatory Visit (AMBULATORY_SURGERY_CENTER): Payer: Self-pay | Admitting: *Deleted

## 2018-10-10 ENCOUNTER — Other Ambulatory Visit: Payer: Self-pay

## 2018-10-10 VITALS — Temp 97.5°F | Ht 69.75 in | Wt 188.0 lb

## 2018-10-10 DIAGNOSIS — Z1211 Encounter for screening for malignant neoplasm of colon: Secondary | ICD-10-CM

## 2018-10-10 NOTE — Progress Notes (Signed)
No egg or soy allergy known to patient  No issues with past sedation with any surgeries  or procedures, no intubation problems  No diet pills per patient No home 02 use per patient  No blood thinners per patient  Pt denies issues with constipation  No A fib or A flutter  EMMI video sent to pt's e mail  Pt in Pv today- he speaks [place] well- Pt sent the interpreter away stating he does not need an interpreter-  Pt tol dme he can read [place] - we discussed prep instructions several times in PV- he verbalized understanding

## 2018-10-16 ENCOUNTER — Encounter: Payer: Self-pay | Admitting: Internal Medicine

## 2018-10-22 ENCOUNTER — Telehealth: Payer: Self-pay | Admitting: Gastroenterology

## 2018-10-22 NOTE — Telephone Encounter (Signed)
Patient called and stated that he needs to reschedule his colonoscopy scheduled for 9/20. I called and spoke with patient and he states he does not feel well enough to undergo procedure currently but knows that he needs to. I will relay message to Dr. Carlean Purl and his team so that they can work on rescheduling as able. He was appreciative for the callback.   Justice Britain, MD [facility] Office # [phone]

## 2018-10-23 ENCOUNTER — Encounter: Payer: PRIVATE HEALTH INSURANCE | Admitting: Internal Medicine

## 2018-11-08 ENCOUNTER — Other Ambulatory Visit: Payer: Self-pay

## 2018-11-08 ENCOUNTER — Emergency Department (HOSPITAL_COMMUNITY)
Admission: EM | Admit: 2018-11-08 | Discharge: 2018-11-08 | Disposition: A | Discharge status: Home / Self Care | Payer: No Typology Code available for payment source | Attending: Emergency Medicine | Admitting: Emergency Medicine

## 2018-11-08 DIAGNOSIS — G8921 Chronic pain due to trauma: Secondary | ICD-10-CM | POA: Insufficient documentation

## 2018-11-08 DIAGNOSIS — M542 Cervicalgia: Secondary | ICD-10-CM | POA: Diagnosis present

## 2018-11-08 DIAGNOSIS — S161XXD Strain of muscle, fascia and tendon at neck level, subsequent encounter: Secondary | ICD-10-CM | POA: Insufficient documentation

## 2018-11-08 DIAGNOSIS — I10 Essential (primary) hypertension: Secondary | ICD-10-CM | POA: Insufficient documentation

## 2018-11-08 DIAGNOSIS — S161XXA Strain of muscle, fascia and tendon at neck level, initial encounter: Secondary | ICD-10-CM

## 2018-11-08 MED ORDER — CYCLOBENZAPRINE HCL 5 MG PO TABS: 5.0000 mg | ORAL_TABLET | Freq: Two times a day (BID) | ORAL | 0 refills | Status: DC | PRN
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
## 2018-11-08 NOTE — Discharge Instructions (Addendum)
Please read attached information. If you experience any new or worsening signs or symptoms please return to the emergency room for evaluation. Please follow-up with your primary care provider or specialist as discussed. Please use medication prescribed only as directed and discontinue taking if you have any concerning signs or symptoms.  Also as we discussed you have some abnormal findings on your CT that require further evaluation with MRI.  Please share these results with your primary care provider and discuss further imaging.
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
## 2018-12-07 NOTE — Telephone Encounter (Signed)
Please contact this man when you get a chance to see if he will reschedule his colonoscopy

## 2018-12-08 NOTE — Telephone Encounter (Signed)
Called patient to reschedule the colonoscopy, no answer, "mail box is full", unable to leave a message. Will try again later.

## 2018-12-11 NOTE — Telephone Encounter (Signed)
Called patient, patient does not want to reschedule the colonoscopy at this time. He is going to try to get new, "better" insurance first. Patient will call us back when he is ready to reschedule the colonoscopy. Pt is aware.

## 2019-02-05 ENCOUNTER — Ambulatory Visit: Payer: Self-pay | Admitting: Nurse Practitioner

## 2019-02-14 ENCOUNTER — Ambulatory Visit: Payer: PRIVATE HEALTH INSURANCE | Attending: Nurse Practitioner | Admitting: Family Medicine

## 2019-02-14 ENCOUNTER — Encounter: Payer: Self-pay | Admitting: Family Medicine

## 2019-02-14 ENCOUNTER — Other Ambulatory Visit: Payer: Self-pay

## 2019-02-14 VITALS — BP 138/85 | HR 53 | Resp 16 | Ht 71.5 in | Wt 190.6 lb

## 2019-02-14 DIAGNOSIS — M542 Cervicalgia: Secondary | ICD-10-CM | POA: Insufficient documentation

## 2019-02-14 DIAGNOSIS — S161XXS Strain of muscle, fascia and tendon at neck level, sequela: Secondary | ICD-10-CM | POA: Insufficient documentation

## 2019-02-14 DIAGNOSIS — R937 Abnormal findings on diagnostic imaging of other parts of musculoskeletal system: Secondary | ICD-10-CM | POA: Insufficient documentation

## 2019-02-14 MED ORDER — NAPROXEN 500 MG PO TABS: 500.0000 mg | ORAL_TABLET | Freq: Two times a day (BID) | ORAL | 0 refills | Status: DC

## 2019-02-14 MED ORDER — NAPROXEN 500 MG PO TABS: 500.0000 mg | ORAL_TABLET | Freq: Two times a day (BID) | ORAL | 2 refills | Status: DC

## 2019-02-14 MED ORDER — CYCLOBENZAPRINE HCL 5 MG PO TABS: ORAL_TABLET | ORAL | 1 refills | Status: DC

## 2019-02-14 MED ORDER — CYCLOBENZAPRINE HCL 5 MG PO TABS: 5.0000 mg | ORAL_TABLET | Freq: Three times a day (TID) | ORAL | 1 refills | Status: DC | PRN
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
## 2019-02-15 LAB — COMPREHENSIVE METABOLIC PANEL WITH GFR
ALT: 15 IU/L (ref 0–44)
AST: 17 IU/L (ref 0–40)
Albumin/Globulin Ratio: 1.4 (ref 1.2–2.2)
Albumin: 4.1 g/dL (ref 3.8–4.9)
Alkaline Phosphatase: 79 IU/L (ref 39–117)
BUN/Creatinine Ratio: 19 (ref 9–20)
BUN: 16 mg/dL (ref 6–24)
Bilirubin Total: 0.5 mg/dL (ref 0.0–1.2)
CO2: 26 mmol/L (ref 20–29)
Calcium: 8.9 mg/dL (ref 8.7–10.2)
Chloride: 109 mmol/L — ABNORMAL HIGH (ref 96–106)
Creatinine, Ser: 0.83 mg/dL (ref 0.76–1.27)
GFR calc Af Amer: 115 mL/min/1.73
GFR calc non Af Amer: 99 mL/min/1.73
Globulin, Total: 2.9 g/dL (ref 1.5–4.5)
Glucose: 81 mg/dL (ref 65–99)
Potassium: 4.7 mmol/L (ref 3.5–5.2)
Sodium: 146 mmol/L — ABNORMAL HIGH (ref 134–144)
Total Protein: 7 g/dL (ref 6.0–8.5)

## 2019-02-15 LAB — CBC WITH DIFFERENTIAL/PLATELET
Basophils Absolute: 0 x10E3/uL (ref 0.0–0.2)
Basos: 1 %
EOS (ABSOLUTE): 0.1 x10E3/uL (ref 0.0–0.4)
Eos: 1 %
Hematocrit: 42.5 % (ref 37.5–51.0)
Hemoglobin: 14.3 g/dL (ref 13.0–17.7)
Immature Grans (Abs): 0 x10E3/uL (ref 0.0–0.1)
Immature Granulocytes: 0 %
Lymphocytes Absolute: 1.7 x10E3/uL (ref 0.7–3.1)
Lymphs: 44 %
MCH: 28.4 pg (ref 26.6–33.0)
MCHC: 33.6 g/dL (ref 31.5–35.7)
MCV: 84 fL (ref 79–97)
Monocytes Absolute: 0.5 x10E3/uL (ref 0.1–0.9)
Monocytes: 13 %
Neutrophils Absolute: 1.6 x10E3/uL (ref 1.4–7.0)
Neutrophils: 41 %
Platelets: 289 x10E3/uL (ref 150–450)
RBC: 5.04 x10E6/uL (ref 4.14–5.80)
RDW: 13 % (ref 11.6–15.4)
WBC: 3.8 x10E3/uL (ref 3.4–10.8)

## 2019-02-19 ENCOUNTER — Telehealth: Payer: Self-pay | Admitting: *Deleted

## 2019-02-19 NOTE — Telephone Encounter (Signed)
Medical Assistant used [employer] to contact patient.  Interpreter Name:  Interpreter #: [id number] .Patient was not available, [employer] left patient a voicemail. Voicemail states to give a call back to [place] with [facility] at [phone].

## 2019-02-19 NOTE — Telephone Encounter (Signed)
-----   Message from Cammie Fulp, MD sent at 02/17/2019  2:23 PM EST ----- Normal complete blood count.  Normal liver enzymes on comprehensive metabolic panel.  Electrolytes within normal with exception of mild increase in sodium and chloride therefore patient should make sure that he decreases the salt in his diet. 

## 2019-02-23 ENCOUNTER — Telehealth (INDEPENDENT_AMBULATORY_CARE_PROVIDER_SITE_OTHER): Payer: Self-pay

## 2019-02-23 NOTE — Telephone Encounter (Signed)
-----   Message from Cain Saupe, MD sent at 02/17/2019  2:23 PM EST ----- Normal complete blood count.  Normal liver enzymes on comprehensive metabolic panel.  Electrolytes within normal with exception of mild increase in sodium and chloride therefore patient should make sure that he decreases the salt in his diet.

## 2019-02-23 NOTE — Telephone Encounter (Signed)
Jonathan Lyons using [employer] [phone]). Lyons verified date of birth. Jonathan Lyons is aware that labs are normal except mild increase in sodium and chloride. Advised Lyons to decrease salt intake in his diet. Jonathan Lyons verbalized understanding of results. Maryjean Morn, CMA

## 2019-03-07 ENCOUNTER — Ambulatory Visit: Payer: No Typology Code available for payment source | Admitting: Pharmacist

## 2019-03-07 ENCOUNTER — Encounter: Payer: Self-pay | Admitting: Family Medicine

## 2019-03-07 ENCOUNTER — Other Ambulatory Visit: Payer: Self-pay

## 2019-03-07 ENCOUNTER — Ambulatory Visit: Payer: No Typology Code available for payment source | Attending: Family Medicine | Admitting: Family Medicine

## 2019-03-07 VITALS — BP 145/90 | HR 52 | Resp 16 | Wt 193.0 lb

## 2019-03-07 DIAGNOSIS — M542 Cervicalgia: Secondary | ICD-10-CM

## 2019-03-07 DIAGNOSIS — R937 Abnormal findings on diagnostic imaging of other parts of musculoskeletal system: Secondary | ICD-10-CM

## 2019-03-07 DIAGNOSIS — S161XXS Strain of muscle, fascia and tendon at neck level, sequela: Secondary | ICD-10-CM

## 2019-03-07 DIAGNOSIS — R030 Elevated blood-pressure reading, without diagnosis of hypertension: Secondary | ICD-10-CM

## 2019-03-07 DIAGNOSIS — M25511 Pain in right shoulder: Secondary | ICD-10-CM

## 2019-03-07 DIAGNOSIS — Z23 Encounter for immunization: Secondary | ICD-10-CM

## 2019-03-07 MED ORDER — NAPROXEN 500 MG PO TABS: 500.0000 mg | ORAL_TABLET | Freq: Two times a day (BID) | ORAL | 2 refills | Status: DC

## 2019-03-07 MED ORDER — PREDNISONE 20 MG PO TABS: ORAL_TABLET | ORAL | 0 refills | Status: DC

## 2019-03-07 MED ORDER — CYCLOBENZAPRINE HCL 5 MG PO TABS: ORAL_TABLET | ORAL | 1 refills | Status: DC
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
## 2019-03-07 NOTE — Patient Instructions (Signed)
Influenza Virus Vaccine injection (Fluarix) What is this medicine? INFLUENZA VIRUS VACCINE (in floo EN zuh VAHY ruhs vak SEEN) helps to reduce the risk of getting influenza also known as the flu. This medicine may be used for other purposes; ask your health care provider or pharmacist if you have questions. COMMON BRAND NAME(S): Fluarix, Fluzone What should I tell my health care provider before I take this medicine? They need to know if you have any of these conditions:  bleeding disorder like hemophilia  fever or infection  Guillain-Barre syndrome or other neurological problems  immune system problems  infection with the human immunodeficiency virus (HIV) or AIDS  low blood platelet counts  multiple sclerosis  an unusual or allergic reaction to influenza virus vaccine, eggs, chicken proteins, latex, gentamicin, other medicines, foods, dyes or preservatives  pregnant or trying to get pregnant  breast-feeding How should I use this medicine? This vaccine is for injection into a muscle. It is given by a health care professional. A copy of Vaccine Information Statements will be given before each vaccination. Read this sheet carefully each time. The sheet may change frequently. Talk to your pediatrician regarding the use of this medicine in children. Special care may be needed. Overdosage: If you think you have taken too much of this medicine contact a poison control center or emergency room at once. NOTE: This medicine is only for you. Do not share this medicine with others. What if I miss a dose? This does not apply. What may interact with this medicine?  chemotherapy or radiation therapy  medicines that lower your immune system like etanercept, anakinra, infliximab, and adalimumab  medicines that treat or prevent blood clots like warfarin  phenytoin  steroid medicines like prednisone or cortisone  theophylline  vaccines This list may not describe all possible  interactions. Give your health care provider a list of all the medicines, herbs, non-prescription drugs, or dietary supplements you use. Also tell them if you smoke, drink alcohol, or use illegal drugs. Some items may interact with your medicine. What should I watch for while using this medicine? Report any side effects that do not go away within 3 days to your doctor or health care professional. Call your health care provider if any unusual symptoms occur within 6 weeks of receiving this vaccine. You may still catch the flu, but the illness is not usually as bad. You cannot get the flu from the vaccine. The vaccine will not protect against colds or other illnesses that may cause fever. The vaccine is needed every year. What side effects may I notice from receiving this medicine? Side effects that you should report to your doctor or health care professional as soon as possible:  allergic reactions like skin rash, itching or hives, swelling of the face, lips, or tongue Side effects that usually do not require medical attention (report to your doctor or health care professional if they continue or are bothersome):  fever  headache  muscle aches and pains  pain, tenderness, redness, or swelling at site where injected  weak or tired This list may not describe all possible side effects. Call your doctor for medical advice about side effects. You may report side effects to FDA at 1-800-FDA-1088. Where should I keep my medicine? This vaccine is only given in a clinic, pharmacy, doctor's office, or other health care setting and will not be stored at home. NOTE: This sheet is a summary. It may not cover all possible information. If you have questions   about this medicine, talk to your doctor, pharmacist, or health care provider.  2020 Elsevier/Gold Standard (2007-08-16 09:30:40)  

## 2019-03-07 NOTE — Progress Notes (Signed)
Patient presents for vaccination against influenza per orders of Dr. Fulp. Consent given. Counseling provided. No contraindications exists. Vaccine administered without incident.   

## 2019-03-23 ENCOUNTER — Ambulatory Visit (HOSPITAL_COMMUNITY): Admission: RE | Admit: 2019-03-23 | Payer: Self-pay | Source: Ambulatory Visit

## 2019-03-27 ENCOUNTER — Ambulatory Visit: Payer: No Typology Code available for payment source | Admitting: Orthopaedic Surgery

## 2019-07-04 ENCOUNTER — Other Ambulatory Visit: Payer: Self-pay

## 2019-07-04 ENCOUNTER — Ambulatory Visit (INDEPENDENT_AMBULATORY_CARE_PROVIDER_SITE_OTHER): Payer: 59

## 2019-07-04 ENCOUNTER — Encounter (HOSPITAL_COMMUNITY): Payer: Self-pay | Admitting: Emergency Medicine

## 2019-07-04 ENCOUNTER — Ambulatory Visit (HOSPITAL_COMMUNITY)
Admission: EM | Admit: 2019-07-04 | Discharge: 2019-07-04 | Disposition: A | Discharge status: Home / Self Care | Payer: 59 | Attending: Emergency Medicine | Admitting: Emergency Medicine

## 2019-07-04 DIAGNOSIS — M546 Pain in thoracic spine: Secondary | ICD-10-CM

## 2019-07-04 MED ORDER — NAPROXEN 500 MG PO TABS: 500.0000 mg | ORAL_TABLET | Freq: Two times a day (BID) | ORAL | 0 refills | Status: DC

## 2019-07-04 MED ORDER — CYCLOBENZAPRINE HCL 5 MG PO TABS: 5.0000 mg | ORAL_TABLET | Freq: Two times a day (BID) | ORAL | 0 refills | Status: DC | PRN

## 2019-07-04 NOTE — Discharge Instructions (Signed)
Xray normal Suspect pain likely back muscular inflammation/strain, should get better with time and anti-inflammatories Naprosyn twice daily with food You may use flexeril as needed to help with pain. This is a muscle relaxer and causes sedation- please use only at bedtime or when you will be home and not have to drive/work

## 2019-07-04 NOTE — ED Provider Notes (Signed)
[facility]    CSN: [id number] Arrival date & time: 07/04/19  1033      History   Chief Complaint Chief Complaint  Patient presents with  . Shortness of Breath    HPI Jonathan Lyons is a 54 y.o. male history of hypertension presenting today for evaluation of back pain.  Patient reports that he has had pain in his mid right back for approximately 1 week.  Over the past week he has had increased pain especially with breathing.  He denies associated cough congestion sore throat or fevers.  Denies history of any underlying asthma, tobacco use.  Denies any injury fall or trauma.  HPI  Past Medical History:  Diagnosis Date  . Hypertension   . Known health problems: none     Patient Active Problem List   Diagnosis Date Noted  . Abnormal CT scan, cervical spine 02/14/2019  . Neck pain 02/14/2019  . Cervical strain, sequela 02/14/2019  . Essential hypertension 09/12/2017    Past Surgical History:  Procedure Laterality Date  . NO PAST SURGERIES         Home Medications    Prior to Admission medications   Medication Sig Start Date End Date Taking? Authorizing Provider  cyclobenzaprine (FLEXERIL) 5 MG tablet Take 1-2 tablets (5-10 mg total) by mouth 2 (two) times daily as needed for muscle spasms. 07/04/19   Devarious Pavek C, PA-C  naproxen (NAPROSYN) 500 MG tablet Take 1 tablet (500 mg total) by mouth 2 (two) times daily with a meal. 07/04/19   Kacey Dysert C, PA-C  famotidine (PEPCID) 20 MG tablet Take 20 mg by mouth daily as needed for heartburn or indigestion.  07/04/19  [provider]    Family History Family History  Problem Relation Age of Onset  . Hypertension Neg Hx   . Diabetes Neg Hx   . Cancer Neg Hx   . Colon cancer Neg Hx   . Colon polyps Neg Hx   . Esophageal cancer Neg Hx   . Rectal cancer Neg Hx   . Stomach cancer Neg Hx     Social History Social History   Tobacco Use  . Smoking status: Never Smoker  . Smokeless tobacco:  Never Used  Substance Use Topics  . Alcohol use: No  . Drug use: No     Allergies   Patient has no known allergies.   Review of Systems Review of Systems  Constitutional: Negative for activity change, appetite change, chills, fatigue and fever.  HENT: Negative for congestion, ear pain, rhinorrhea, sinus pressure, sore throat and trouble swallowing.   Eyes: Negative for discharge and redness.  Respiratory: Negative for cough, chest tightness and shortness of breath.   Cardiovascular: Negative for chest pain.  Gastrointestinal: Negative for abdominal pain, diarrhea, nausea and vomiting.  Musculoskeletal: Positive for back pain and myalgias.  Skin: Negative for rash.  Neurological: Negative for dizziness, light-headedness and headaches.     Physical Exam Triage Vital Signs ED Triage Vitals  Enc Vitals Group     BP 07/04/19 1134 (!) 142/93     Pulse Rate 07/04/19 1134 60     Resp 07/04/19 1134 18     Temp 07/04/19 1134 98.4 F (36.9 C)     Temp Source 07/04/19 1134 Oral     SpO2 07/04/19 1134 100 %     Weight --      Height --      Head Circumference --      Peak  Flow --      Pain Score 07/04/19 1131 6     Pain Loc --      Pain Edu? --      Excl. in [place]? --    No data found.  Updated Vital Signs BP (!) 142/93 (BP Location: Left Arm)   Pulse 60   Temp 98.4 F (36.9 C) (Oral)   Resp 18   SpO2 100%   Visual Acuity Right Eye Distance:   Left Eye Distance:   Bilateral Distance:    Right Eye Near:   Left Eye Near:    Bilateral Near:     Physical Exam Vitals and nursing note reviewed.  Constitutional:      Appearance: He is well-developed.     Comments: No acute distress  HENT:     Head: Normocephalic and atraumatic.     Nose: Nose normal.  Eyes:     Conjunctiva/sclera: Conjunctivae normal.  Cardiovascular:     Rate and Rhythm: Normal rate.  Pulmonary:     Effort: Pulmonary effort is normal. No respiratory distress.     Comments: Breathing comfortably  at rest, CTABL, no wheezing, rales or other adventitious sounds auscultated Abdominal:     General: There is no distension.  Musculoskeletal:        General: Normal range of motion.     Cervical back: Neck supple.     Comments: Back: Tender to palpation to lower cervical/superior thoracic spine midline, mild tenderness to palpation to lower thoracic spine midline, increased tenderness throughout right lower thoracic musculature extending to flank  Full active range of motion of bilateral upper extremities  Skin:    General: Skin is warm and dry.  Neurological:     Mental Status: He is alert and oriented to person, place, and time.      UC Treatments / Results  Labs (all labs ordered are listed, but only abnormal results are displayed) Labs Reviewed - No data to display  EKG   Radiology No results found.  Procedures Procedures (including critical care time)  Medications Ordered in UC Medications - No data to display  Initial Impression / Assessment and Plan / UC Course  I have reviewed the triage vital signs and the nursing notes.  Pertinent labs & imaging results that were available during my care of the patient were reviewed by me and considered in my medical decision making (see chart for details).     X-ray unremarkable, independently reviewed by myself. Suspect likely muscular strain/inflammation of thoracic region of back on right side. Recommending anti-inflammatories and muscle relaxers with continued close monitoring.Discussed strict return precautions. Patient verbalized understanding and is agreeable with plan.  Final Clinical Impressions(s) / UC Diagnoses   Final diagnoses:  Acute right-sided thoracic back pain     Discharge Instructions     Xray normal Suspect pain likely back muscular inflammation/strain, should get better with time and anti-inflammatories Naprosyn twice daily with food You may use flexeril as needed to help with pain. This is a  muscle relaxer and causes sedation- please use only at bedtime or when you will be home and not have to [facility]      ED Prescriptions    Medication Sig Dispense Auth. Provider   naproxen (NAPROSYN) 500 MG tablet Take 1 tablet (500 mg total) by mouth 2 (two) times daily with a meal. 30 tablet Wenona Mayville C, PA-C   cyclobenzaprine (FLEXERIL) 5 MG tablet Take 1-2 tablets (5-10 mg total) by mouth 2 (  two) times daily as needed for muscle spasms. 24 tablet Vienne Corcoran, [place] C, PA-C     PDMP not reviewed this encounter.   Lew Dawes, [place] 07/04/19 1228

## 2019-07-04 NOTE — ED Triage Notes (Signed)
Pin in right mid to lower back for a week.  Having difficulty breathing, sob, pain in mentioned area with deep breaths  Denies cough, runny nose, fever.    Patient has not had the covid vaccine

## 2019-07-12 ENCOUNTER — Ambulatory Visit: Payer: 59 | Attending: Family Medicine | Admitting: Pharmacist

## 2019-07-12 ENCOUNTER — Other Ambulatory Visit: Payer: Self-pay

## 2019-07-12 ENCOUNTER — Encounter: Payer: Self-pay | Admitting: Pharmacist

## 2019-07-12 VITALS — BP 146/76 | HR 61

## 2019-07-12 DIAGNOSIS — I10 Essential (primary) hypertension: Secondary | ICD-10-CM

## 2019-07-12 MED ORDER — CYCLOBENZAPRINE HCL 5 MG PO TABS: 5.0000 mg | ORAL_TABLET | Freq: Two times a day (BID) | ORAL | 0 refills | Status: DC | PRN

## 2019-07-12 MED ORDER — AMLODIPINE BESYLATE 5 MG PO TABS: 5.0000 mg | ORAL_TABLET | Freq: Every day | ORAL | 0 refills | Status: DC

## 2019-07-12 NOTE — Progress Notes (Signed)
   S:    Patient arrives in good spirits. Presents to the clinic for BP check.  Patient was referred and last seen by Primary Care Provider on 03/07/2019. BP was elevated at that visit.    Medication adherence: pt does not currently take BP medication.  Current BP Medications include:  None   Antihypertensives tried in the past include: HCTZ  Dietary habits include: denies eating excess sodium or drinking excess caffeine  Exercise habits include: does not exercise daily  Family / Social history:  - FHx: no pertinent positives  - Tobacco: never smoker  - Alcohol: denies use  O:  Vitals:   07/12/19 1116  BP: (!) 146/76  Pulse: 61    Home BP readings: none   Last 3 Office BP readings: BP Readings from Last 3 Encounters:  07/12/19 (!) 146/76  07/04/19 (!) 142/93  03/07/19 (!) 145/90    BMET    Component Value Date/Time   NA 146 (H) 02/14/2019 1213   K 4.7 02/14/2019 1213   CL 109 (H) 02/14/2019 1213   CO2 26 02/14/2019 1213   GLUCOSE 81 02/14/2019 1213   GLUCOSE 81 07/25/2018 2020   BUN 16 02/14/2019 1213   CREATININE 0.83 02/14/2019 1213   CALCIUM 8.9 02/14/2019 1213   GFRNONAA 99 02/14/2019 1213   GFRAA 115 02/14/2019 1213    Renal function: CrCl cannot be calculated (Patient's most recent lab result is older than the maximum 21 days allowed.).  Clinical ASCVD: No  The ASCVD Risk score Denman George DC Jr., et al., 2013) failed to calculate for the following reasons:   Cannot find a previous HDL lab   Cannot find a previous total cholesterol lab   A/P: Hypertension longstanding currently above goal on current medications. BP Goal = < 130/80 mmHg. He was on HCTZ previously but this was stopped d/t hypokalemia.  -Started amlodipine 5 mg daily.  -Counseled on lifestyle modifications for blood pressure control including reduced dietary sodium, increased exercise, adequate sleep.  Results reviewed and written information provided.   Total time in face-to-face counseling  15 minutes.   F/U Clinic Visit in 1 month with PCP.  Butch Penny, PharmD, CPP Clinical Pharmacist [facility] [phone]

## 2019-07-13 ENCOUNTER — Encounter: Payer: Self-pay | Admitting: Family Medicine

## 2019-07-16 ENCOUNTER — Telehealth: Payer: Self-pay | Admitting: Family Medicine

## 2019-07-16 NOTE — Telephone Encounter (Signed)
Pt was advice to contact the Billing costumer service and they be able to help since his bill are very old

## 2019-08-16 ENCOUNTER — Other Ambulatory Visit: Payer: Self-pay

## 2019-08-16 ENCOUNTER — Ambulatory Visit: Payer: Self-pay | Attending: Family Medicine | Admitting: Family Medicine

## 2019-08-16 ENCOUNTER — Encounter: Payer: Self-pay | Admitting: Family Medicine

## 2019-08-16 VITALS — BP 122/72 | HR 57 | Ht 71.0 in | Wt 192.0 lb

## 2019-08-16 DIAGNOSIS — I10 Essential (primary) hypertension: Secondary | ICD-10-CM

## 2019-08-16 DIAGNOSIS — K2900 Acute gastritis without bleeding: Secondary | ICD-10-CM

## 2019-08-16 DIAGNOSIS — M47812 Spondylosis without myelopathy or radiculopathy, cervical region: Secondary | ICD-10-CM

## 2019-08-16 MED ORDER — HYDROCHLOROTHIAZIDE 25 MG PO TABS: 25.0000 mg | ORAL_TABLET | Freq: Every day | ORAL | 3 refills | Status: DC

## 2019-08-16 MED ORDER — PANTOPRAZOLE SODIUM 40 MG PO TBEC: 40.0000 mg | DELAYED_RELEASE_TABLET | Freq: Every day | ORAL | 3 refills | Status: DC

## 2019-08-16 MED ORDER — NAPROXEN 500 MG PO TABS: 500.0000 mg | ORAL_TABLET | Freq: Two times a day (BID) | ORAL | 2 refills | Status: DC

## 2019-08-16 MED ORDER — CYCLOBENZAPRINE HCL 5 MG PO TABS: 5.0000 mg | ORAL_TABLET | Freq: Two times a day (BID) | ORAL | 2 refills | Status: DC | PRN

## 2019-08-16 NOTE — Patient Instructions (Signed)

## 2019-08-16 NOTE — Progress Notes (Signed)
Having pain in neck and back.  Pain abdomen.
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
## 2019-08-17 ENCOUNTER — Telehealth: Payer: Self-pay

## 2019-08-17 LAB — BASIC METABOLIC PANEL
BUN/Creatinine Ratio: 11 (ref 9–20)
BUN: 9 mg/dL (ref 6–24)
CO2: 25 mmol/L (ref 20–29)
Calcium: 9.2 mg/dL (ref 8.7–10.2)
Chloride: 108 mmol/L — ABNORMAL HIGH (ref 96–106)
Creatinine, Ser: 0.79 mg/dL (ref 0.76–1.27)
GFR calc Af Amer: 117 mL/min/{1.73_m2} (ref >59)
GFR calc non Af Amer: 101 mL/min/{1.73_m2} (ref >59)
Glucose: 89 mg/dL (ref 65–99)
Potassium: 4 mmol/L (ref 3.5–5.2)
Sodium: 142 mmol/L (ref 134–144)

## 2019-08-17 NOTE — Telephone Encounter (Signed)
-----   Message from Hoy Register, MD sent at 08/17/2019  9:21 AM EDT ----- Please inform the patient that labs are normal. Thank you.

## 2019-08-17 NOTE — Telephone Encounter (Signed)
Patient was called and informed of normal lab results. 

## 2019-10-05 ENCOUNTER — Other Ambulatory Visit: Payer: Self-pay | Admitting: Family Medicine

## 2019-11-18 ENCOUNTER — Other Ambulatory Visit: Payer: Self-pay | Admitting: Family Medicine

## 2019-11-22 ENCOUNTER — Other Ambulatory Visit: Payer: Self-pay

## 2019-11-22 ENCOUNTER — Ambulatory Visit: Payer: Self-pay | Attending: Family Medicine | Admitting: Family Medicine

## 2019-11-22 ENCOUNTER — Encounter: Payer: Self-pay | Admitting: Family Medicine

## 2019-11-22 DIAGNOSIS — M47812 Spondylosis without myelopathy or radiculopathy, cervical region: Secondary | ICD-10-CM
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
## 2019-11-22 NOTE — Progress Notes (Signed)
Patient has FMLA that needs to be filled out.

## 2020-02-10 ENCOUNTER — Other Ambulatory Visit: Payer: Self-pay | Admitting: Family Medicine

## 2020-02-10 DIAGNOSIS — K2900 Acute gastritis without bleeding: Secondary | ICD-10-CM

## 2020-09-09 ENCOUNTER — Ambulatory Visit: Payer: 59 | Attending: Nurse Practitioner | Admitting: Nurse Practitioner

## 2020-09-09 ENCOUNTER — Other Ambulatory Visit: Payer: Self-pay

## 2020-09-09 ENCOUNTER — Encounter: Payer: Self-pay | Admitting: Nurse Practitioner

## 2020-09-09 DIAGNOSIS — R7989 Other specified abnormal findings of blood chemistry: Secondary | ICD-10-CM

## 2020-09-09 DIAGNOSIS — I10 Essential (primary) hypertension: Secondary | ICD-10-CM

## 2020-09-09 DIAGNOSIS — M545 Low back pain, unspecified: Secondary | ICD-10-CM

## 2020-09-09 DIAGNOSIS — M47812 Spondylosis without myelopathy or radiculopathy, cervical region: Secondary | ICD-10-CM

## 2020-09-09 DIAGNOSIS — Z1211 Encounter for screening for malignant neoplasm of colon: Secondary | ICD-10-CM

## 2020-09-09 DIAGNOSIS — E876 Hypokalemia: Secondary | ICD-10-CM

## 2020-09-09 DIAGNOSIS — Z1159 Encounter for screening for other viral diseases: Secondary | ICD-10-CM

## 2020-09-09 DIAGNOSIS — Z114 Encounter for screening for human immunodeficiency virus [HIV]: Secondary | ICD-10-CM

## 2020-09-09 DIAGNOSIS — Z13220 Encounter for screening for lipoid disorders: Secondary | ICD-10-CM

## 2020-09-09 DIAGNOSIS — Z8719 Personal history of other diseases of the digestive system: Secondary | ICD-10-CM

## 2020-09-09 MED ORDER — NAPROXEN 500 MG PO TABS: 500.0000 mg | ORAL_TABLET | Freq: Two times a day (BID) | ORAL | 2 refills | Status: DC

## 2020-09-09 MED ORDER — HYDROCHLOROTHIAZIDE 25 MG PO TABS: 25.0000 mg | ORAL_TABLET | Freq: Every day | ORAL | 0 refills | Status: DC

## 2020-09-09 MED ORDER — PANTOPRAZOLE SODIUM 40 MG PO TBEC: 40.0000 mg | DELAYED_RELEASE_TABLET | Freq: Every day | ORAL | 1 refills | Status: DC

## 2020-09-09 MED ORDER — CYCLOBENZAPRINE HCL 5 MG PO TABS: 5.0000 mg | ORAL_TABLET | Freq: Two times a day (BID) | ORAL | 2 refills | Status: DC | PRN

## 2020-09-09 NOTE — Progress Notes (Addendum)
Virtual Visit via Telephone Note Due to national recommendations of social distancing due to [place] 19, telehealth visit is felt to be most appropriate for this patient at this time.  I discussed the limitations, risks, security and privacy concerns of performing an evaluation and management service by telephone and the availability of in person appointments. I also discussed with the patient that there may be a patient responsible charge related to this service. The patient expressed understanding and agreed to proceed.    I connected with Jonathan Lyons on 09/09/20  at   3:30 PM EDT  EDT by telephone and verified that I am speaking with the correct person using two identifiers.  Location of Patient: Private Residence   Location of Provider: [place] [facility] [employer] Office    Persons participating in Telemedicine visit: Jonathan Rankins FNP-BC [place]    History of Present Illness: Telemedicine visit for: Follow up for HTN He has a past medical history of Essential hypertension (09/12/2017), Hypertension, and Known health problems: none.    Essential Hypertension Well controlled. He has been out of HCTZ for quite some time now. Endorses BLE edema at the end of the day after standing at work for hours. He does eat out for lunch which likely has an increased amount of sodium. Denies chest pain, shortness of breath, palpitations, lightheadedness, dizziness, headaches BP Readings from Last 3 Encounters:  08/16/19 122/72  07/12/19 (!) 146/76  07/04/19 (!) 142/93       Past Medical History:  Diagnosis Date   Essential hypertension 09/12/2017   Hypertension    Known health problems: none     Past Surgical History:  Procedure Laterality Date   NO PAST SURGERIES      Family History  Problem Relation Age of Onset   Hypertension Neg Hx    Diabetes Neg Hx    Cancer Neg Hx    Colon cancer Neg Hx    Colon polyps Neg Hx    Esophageal cancer Neg Hx    Rectal  cancer Neg Hx    Stomach cancer Neg Hx     Social History   Socioeconomic History   Marital status: Married    Spouse name: Not on file   Number of children: 5   Years of education: Not on file   Highest education level: Not on file  Occupational History   Occupation: Geophysicist/field seismologist  Tobacco Use   Smoking status: Never   Smokeless tobacco: Never  Vaping Use   Vaping Use: Never used  Substance and Sexual Activity   Alcohol use: No   Drug use: No   Sexual activity: Not on file  Other Topics Concern   Not on file  Social History Narrative   ** Merged History Encounter **       Separated - 5 grown children 1 daughter 4 sons Biochemist, clinical  No EtOH, tobacco or drugs Emigrated from [place] decades ahgo   Social Determinants of Radio broadcast assistant Strain: Not on file  Food Insecurity: Not on file  Transportation Needs: Not on file  Physical Activity: Not on file  Stress: Not on file  Social Connections: Not on file     Observations/Objective: Awake, alert and oriented x 3   Review of Systems  Constitutional:  Negative for fever, malaise/fatigue and weight loss.  HENT: Negative.  Negative for nosebleeds.   Eyes: Negative.  Negative for blurred vision, double vision and photophobia.  Respiratory: Negative.  Negative for cough, shortness  of breath and wheezing.   Cardiovascular:  Positive for leg swelling. Negative for chest pain and palpitations.  Gastrointestinal: Negative.  Negative for heartburn, nausea and vomiting.  Genitourinary:  Positive for flank pain. Negative for dysuria, frequency, hematuria and urgency.  Musculoskeletal:  Positive for back pain. Negative for myalgias.  Neurological: Negative.  Negative for dizziness, focal weakness, seizures and headaches.  Psychiatric/Behavioral: Negative.  Negative for suicidal ideas.    Assessment and Plan: Diagnoses and all orders for this visit:  Primary hypertension -     hydrochlorothiazide (HYDRODIURIL)  25 MG tablet; Take 1 tablet (25 mg total) by mouth daily. Continue all antihypertensives as prescribed.  Remember to bring in your blood pressure log with you for your follow up appointment.  DASH/Mediterranean Diets are healthier choices for HTN.    Low back pain at multiple sites -     Urinalysis, Complete; Future -     PSA; Future  Need for hepatitis C screening test -     HCV Ab w Reflex to Quant PCR; Future  Encounter for screening for HIV -     HIV antibody (with reflex); Future  Colon cancer screening -     Ambulatory referral to Gastroenterology  Lipid screening -     Lipid panel; Future  Abnormal CBC -     CBC; Future  Hypokalemia -     CMP14+EGFR; Future  Spondylosis of cervical region without myelopathy or radiculopathy -     cyclobenzaprine (FLEXERIL) 5 MG tablet; Take 1 tablet (5 mg total) by mouth 2 (two) times daily as needed for muscle spasms. -     naproxen (NAPROSYN) 500 MG tablet; Take 1 tablet (500 mg total) by mouth 2 (two) times daily with a meal. Work on losing weight to help reduce back pain. May alternate with heat and ice application for pain relief. May also alternate with acetaminophen as prescribed for back pain. Other alternatives include massage, acupuncture and water aerobics.  You must stay active and avoid a sedentary lifestyle.    History of gastritis -     pantoprazole (PROTONIX) 40 MG tablet; Take 1 tablet (40 mg total) by mouth daily. INSTRUCTIONS: Avoid GERD Triggers: acidic, spicy or fried foods, caffeine, coffee, sodas,  alcohol and chocolate.      Follow Up Instructions Return in about 6 weeks (around 10/21/2020) for BP recheck.     I discussed the assessment and treatment plan with the patient. The patient was provided an opportunity to ask questions and all were answered. The patient agreed with the plan and demonstrated an understanding of the instructions.   The patient was advised to call back or seek an in-person evaluation if  the symptoms worsen or if the condition fails to improve as anticipated.  I provided 10 minutes of non-face-to-face time during this encounter including median intraservice time, reviewing previous notes, labs, imaging, medications and explaining diagnosis and management.  Gildardo Pounds, FNP-BC

## 2020-09-10 NOTE — Addendum Note (Signed)
Addended byMemory Dance on: 09/10/2020 10:31 AM   Modules accepted: Orders

## 2020-09-11 LAB — CMP14+EGFR
ALT: 14 IU/L (ref 0–44)
AST: 15 IU/L (ref 0–40)
Albumin/Globulin Ratio: 1.7 (ref 1.2–2.2)
Albumin: 4.4 g/dL (ref 3.8–4.9)
Alkaline Phosphatase: 66 IU/L (ref 44–121)
BUN/Creatinine Ratio: 17 (ref 9–20)
BUN: 14 mg/dL (ref 6–24)
Bilirubin Total: 0.4 mg/dL (ref 0.0–1.2)
CO2: 25 mmol/L (ref 20–29)
Calcium: 9.4 mg/dL (ref 8.7–10.2)
Chloride: 105 mmol/L (ref 96–106)
Creatinine, Ser: 0.83 mg/dL (ref 0.76–1.27)
Globulin, Total: 2.6 g/dL (ref 1.5–4.5)
Glucose: 97 mg/dL (ref 65–99)
Potassium: 4.1 mmol/L (ref 3.5–5.2)
Sodium: 141 mmol/L (ref 134–144)
Total Protein: 7 g/dL (ref 6.0–8.5)
eGFR: 103 mL/min/{1.73_m2} (ref >59)

## 2020-09-11 LAB — CBC
Hematocrit: 44.3 % (ref 37.5–51.0)
Hemoglobin: 14.5 g/dL (ref 13.0–17.7)
MCH: 28 pg (ref 26.6–33.0)
MCHC: 32.7 g/dL (ref 31.5–35.7)
MCV: 86 fL (ref 79–97)
Platelets: 199 10*3/uL (ref 150–450)
RBC: 5.17 x10E6/uL (ref 4.14–5.80)
RDW: 13.1 % (ref 11.6–15.4)
WBC: 4.4 10*3/uL (ref 3.4–10.8)

## 2020-09-11 LAB — MICROSCOPIC EXAMINATION
Bacteria, UA: NONE SEEN
Casts: NONE SEEN /lpf
Epithelial Cells (non renal): NONE SEEN /hpf (ref 0–10)
RBC, Urine: NONE SEEN /hpf (ref 0–2)
WBC, UA: NONE SEEN /hpf (ref 0–5)

## 2020-09-11 LAB — LIPID PANEL
Chol/HDL Ratio: 2.4 ratio (ref 0.0–5.0)
Cholesterol, Total: 142 mg/dL (ref 100–199)
HDL: 60 mg/dL (ref >39)
LDL Chol Calc (NIH): 73 mg/dL (ref 0–99)
Triglycerides: 39 mg/dL (ref 0–149)
VLDL Cholesterol Cal: 9 mg/dL (ref 5–40)

## 2020-09-11 LAB — URINALYSIS, COMPLETE
Bilirubin, UA: NEGATIVE
Glucose, UA: NEGATIVE
Ketones, UA: NEGATIVE
Leukocytes,UA: NEGATIVE
Nitrite, UA: NEGATIVE
Protein,UA: NEGATIVE
RBC, UA: NEGATIVE
Specific Gravity, UA: 1.021 (ref 1.005–1.030)
Urobilinogen, Ur: 0.2 mg/dL (ref 0.2–1.0)
pH, UA: 6 (ref 5.0–7.5)

## 2020-09-11 LAB — PSA: Prostate Specific Ag, Serum: 1.2 ng/mL (ref 0.0–4.0)

## 2020-09-11 LAB — HIV ANTIBODY (ROUTINE TESTING W REFLEX): HIV Screen 4th Generation wRfx: NONREACTIVE

## 2020-09-11 LAB — HCV AB W REFLEX TO QUANT PCR: HCV Ab: <0.1 s/co ratio (ref 0.0–0.9)

## 2020-09-11 LAB — HCV INTERPRETATION
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
## 2020-10-24 ENCOUNTER — Ambulatory Visit: Payer: 59 | Attending: Nurse Practitioner | Admitting: Nurse Practitioner

## 2020-10-24 ENCOUNTER — Encounter: Payer: Self-pay | Admitting: Nurse Practitioner

## 2020-10-24 VITALS — BP 160/87 | HR 52 | Ht 71.0 in | Wt 189.0 lb

## 2020-10-24 DIAGNOSIS — I10 Essential (primary) hypertension: Secondary | ICD-10-CM | POA: Diagnosis not present

## 2020-10-24 DIAGNOSIS — G8929 Other chronic pain: Secondary | ICD-10-CM

## 2020-10-24 DIAGNOSIS — M542 Cervicalgia: Secondary | ICD-10-CM

## 2020-10-24 MED ORDER — LOSARTAN POTASSIUM 25 MG PO TABS: 25.0000 mg | ORAL_TABLET | Freq: Every day | ORAL | 1 refills | Status: DC
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
## 2020-10-31 ENCOUNTER — Telehealth: Payer: Self-pay | Admitting: Nurse Practitioner

## 2020-10-31 NOTE — Telephone Encounter (Signed)
Copied from CRM [id number]. Topic: General - Other >> Oct 30, 2020  3:52 PM Traci Sermon wrote: Reason for CRM: Tonya from [employer] called in to get a pre authorization for pt MRI, please advise CB: [phone]

## 2020-10-31 NOTE — Telephone Encounter (Signed)
Called insurance and faxed over notes from last visit waiting for the response.

## 2020-11-03 ENCOUNTER — Ambulatory Visit (HOSPITAL_COMMUNITY): Payer: 59

## 2020-11-14 ENCOUNTER — Ambulatory Visit: Payer: 59 | Attending: Nurse Practitioner | Admitting: Pharmacist

## 2020-11-14 ENCOUNTER — Other Ambulatory Visit: Payer: Self-pay

## 2020-11-14 VITALS — BP 149/86 | HR 46

## 2020-11-14 DIAGNOSIS — I10 Essential (primary) hypertension: Secondary | ICD-10-CM | POA: Diagnosis not present

## 2020-11-14 MED ORDER — AMLODIPINE BESYLATE 5 MG PO TABS: 5.0000 mg | ORAL_TABLET | Freq: Every day | ORAL | 0 refills | Status: DC

## 2020-11-14 NOTE — Progress Notes (Signed)
   S:    Patient arrives in good spirits. Presents to the clinic for hypertension evaluation, counseling, and management. Patient was referred and last seen by Ms. Shon Hale on 10/24/2020. Per last PCP visit, blood pressure was elevated at 160/87 mmHg. Of note, his HCTZ was changed to losartan d/t dizziness.   Today, patient reports feeling well. Denies any chest pain, shortness of breath, headaches or blurred vision. Medication adherence reported. Patient reports taking losartan 25 mg tablets daily in the morning. Patient reports taking morning dose prior to visit.   Current BP Medications include:  Losartan 25 mg daily  Antihypertensives tried in the past include: HCTZ 25 mg daily (dizziness)  Dietary habits include: chicken, rice, pita bread, tea, water - denies salt intake Exercise habits include:none Family / Social history: denies tobacco use, smokeless tobacco use, or alcohol use  ASCVD risk factors include: hypertension   O:  Vitals:   11/14/20 1021  BP: (!) 149/86  Pulse: (!) 46   Home BP readings: none  Last 3 Office BP readings: BP Readings from Last 3 Encounters:  11/14/20 (!) 149/86  10/24/20 (!) 160/87  08/16/19 122/72   BMET    Component Value Date/Time   NA 141 09/10/2020 1034   K 4.1 09/10/2020 1034   CL 105 09/10/2020 1034   CO2 25 09/10/2020 1034   GLUCOSE 97 09/10/2020 1034   GLUCOSE 81 07/25/2018 2020   BUN 14 09/10/2020 1034   CREATININE 0.83 09/10/2020 1034   CALCIUM 9.4 09/10/2020 1034   GFRNONAA 101 08/16/2019 1130   GFRAA 117 08/16/2019 1130   Clinical ASCVD: No  The 10-year ASCVD risk score (Arnett DK, et al., 2019) is: 12.9%   Values used to calculate the score:     Age: 75 years     Sex: Male     Is Non-Hispanic African American: Yes     Diabetic: No     Tobacco smoker: No     Systolic Blood Pressure: 149 mmHg     Is BP treated: Yes     HDL Cholesterol: 60 mg/dL     Total Cholesterol: 142 mg/dL   A/P: Hypertension Goal: < 130/80  mmHg. Currently longstanding above goal (149/86 mmHg) but improved on current medications. Denies at home blood pressure monitoring. Medication adherence reported. Patient denies any missed doses in the last 2 weeks. BMET collected in lab today after initiation of Losartan at PCP visit.    - Start Amlodipine 5 mg daily - Continue Losartan 25 mg daily - Counseled on lifestyle modifications for blood pressure control including reduced dietary sodium, increased exercise, adequate sleep.  Follow up in 4 weeks with Franky Macho  Follow up with Ms. Shon Hale on 01/23/2021  Patient seen by: Johnston Ebbs, Student Pharmacist [employer]  Butch Penny, PharmD, [place], CPP Clinical Pharmacist [facility] [phone]  Results reviewed and written information provided.   Total time in face-to-face counseling 20 minutes.

## 2020-11-15 LAB — CMP14+EGFR
ALT: 11 IU/L (ref 0–44)
AST: 15 IU/L (ref 0–40)
Albumin/Globulin Ratio: 1.5 (ref 1.2–2.2)
Albumin: 4 g/dL (ref 3.8–4.9)
Alkaline Phosphatase: 69 IU/L (ref 44–121)
BUN/Creatinine Ratio: 17 (ref 9–20)
BUN: 14 mg/dL (ref 6–24)
Bilirubin Total: 0.3 mg/dL (ref 0.0–1.2)
CO2: 24 mmol/L (ref 20–29)
Calcium: 8.8 mg/dL (ref 8.7–10.2)
Chloride: 104 mmol/L (ref 96–106)
Creatinine, Ser: 0.81 mg/dL (ref 0.76–1.27)
Globulin, Total: 2.7 g/dL (ref 1.5–4.5)
Glucose: 94 mg/dL (ref 70–99)
Potassium: 4 mmol/L (ref 3.5–5.2)
Sodium: 140 mmol/L (ref 134–144)
Total Protein: 6.7 g/dL (ref 6.0–8.5)
eGFR: 103 mL/min/{1.73_m2} (ref >59)

## 2020-11-20 ENCOUNTER — Telehealth: Payer: Self-pay

## 2020-11-20 NOTE — Telephone Encounter (Signed)
Patient was called and informed of lab results. Patient had no questions.  

## 2020-12-07 ENCOUNTER — Other Ambulatory Visit: Payer: Self-pay | Admitting: Nurse Practitioner

## 2020-12-07 DIAGNOSIS — I10 Essential (primary) hypertension: Secondary | ICD-10-CM

## 2020-12-07 NOTE — Telephone Encounter (Signed)
dc'd 10/24/20 Side effects Dr Meredeth Ide

## 2021-01-17 ENCOUNTER — Other Ambulatory Visit: Payer: Self-pay | Admitting: Family Medicine

## 2021-01-17 DIAGNOSIS — I10 Essential (primary) hypertension: Secondary | ICD-10-CM

## 2021-01-18 NOTE — Telephone Encounter (Signed)
last RF 11/14/20 #90 current fill should last to mid Jan 2023  Requested Prescriptions  Refused Prescriptions Disp Refills   amLODipine (NORVASC) 5 MG tablet [Pharmacy Med Name: AMLODIPINE BESYLATE 5 MG TAB] 15 tablet 5    Sig: TAKE 1 TABLET (5 MG TOTAL) BY MOUTH DAILY.     Cardiovascular:  Calcium Channel Blockers Failed - 01/17/2021  7:04 PM      Failed - Last BP in normal range    BP Readings from Last 1 Encounters:  11/14/20 (!) 149/86         Passed - Valid encounter within last 6 months    Recent Outpatient Visits          2 months ago Essential hypertension   [place] [facility] [place], Cornelius Moras, RPH-CPP   2 months ago Essential hypertension   [facility] [place], Shea Stakes, NP   4 months ago Primary hypertension   [place] [address] [facility] [place], Shea Stakes, NP   1 year ago Spondylosis of cervical region without myelopathy or radiculopathy   [facility] [place], Odette Horns, MD   1 year ago Spondylosis of cervical region without myelopathy or radiculopathy   [place] [facility] Hoy Register, MD

## 2021-01-19 ENCOUNTER — Ambulatory Visit: Payer: Self-pay

## 2021-01-19 NOTE — Telephone Encounter (Signed)
Reason for Disposition  [1] MODERATE leg swelling (e.g., swelling extends up to knees) AND [2] new-onset or worsening  Answer Assessment - Initial Assessment Questions 1. ONSET: "When did the swelling start?" (e.g., minutes, hours, days)     2 months  2. LOCATION: "What part of the leg is swollen?"  "Are both legs swollen or just one leg?"     Right foot 3. SEVERITY: "How bad is the swelling?" (e.g., localized; mild, moderate, severe)  - Localized - small area of swelling localized to one leg  - MILD pedal edema - swelling limited to foot and ankle, pitting edema < 1/4 inch (6 mm) deep, rest and elevation eliminate most or all swelling  - MODERATE edema - swelling of lower leg to knee, pitting edema > 1/4 inch (6 mm) deep, rest and elevation only partially reduce swelling  - SEVERE edema - swelling extends above knee, facial or hand swelling present      mild 4. REDNESS: "Does the swelling look red or infected?"     no 5. PAIN: "Is the swelling painful to touch?" If Yes, ask: "How painful is it?"   (Scale 1-10; mild, moderate or severe)     Hurts after standing up from sitting position 6. FEVER: "Do you have a fever?" If Yes, ask: "What is it, how was it measured, and when did it start?"      no 7. CAUSE: "What do you think is causing the leg swelling?"     Work related. Started at toes and  8. MEDICAL HISTORY: "Do you have a history of heart failure, kidney disease, liver failure, or cancer?"     no 9. RECURRENT SYMPTOM: "Have you had leg swelling before?" If Yes, ask: "When was the last time?" "What happened that time?"   Top of feet  10. OTHER SYMPTOMS: "Do you have any other symptoms?" (e.g., chest pain, difficulty breathing)       Lower back pain intermittent- mild  Protocols used: Leg Swelling and Edema-A-AH

## 2021-01-21 ENCOUNTER — Other Ambulatory Visit: Payer: Self-pay

## 2021-01-21 ENCOUNTER — Encounter (HOSPITAL_COMMUNITY): Payer: Self-pay | Admitting: Emergency Medicine

## 2021-01-21 ENCOUNTER — Ambulatory Visit (HOSPITAL_COMMUNITY)
Admission: EM | Admit: 2021-01-21 | Discharge: 2021-01-21 | Disposition: A | Discharge status: Home / Self Care | Payer: 59

## 2021-01-21 DIAGNOSIS — M722 Plantar fascial fibromatosis: Secondary | ICD-10-CM

## 2021-01-21 DIAGNOSIS — R609 Edema, unspecified: Secondary | ICD-10-CM

## 2021-01-21 DIAGNOSIS — M79671 Pain in right foot: Secondary | ICD-10-CM

## 2021-01-21 MED ORDER — MELOXICAM 15 MG PO TABS: 15.0000 mg | ORAL_TABLET | Freq: Every day | ORAL | 0 refills | Status: DC

## 2021-01-21 NOTE — Discharge Instructions (Signed)
I believe that you have plantar fasciitis causing your foot pain.  Start Mobic daily.  You should not take NSAIDs including aspirin, ibuprofen/Advil, naproxen/Aleve with this medication as it can cause stomach bleeding.  You can use Tylenol for breakthrough pain.  Wear supportive footwear and try to avoid prolonged standing.  I do recommend you follow-up with a podiatrist as we discussed.  Please call them to schedule an appointment.  You do not have any swelling today.  I believe the swelling is related to dependent edema as we discussed.  Please keep your feet elevated is much as possible, use compression stockings at work, avoid sodium.  If you do have any worsening symptoms including swelling that does not resolve, shortness of breath, nausea, vomiting, fatigue you need to be seen immediately.  Follow-up with either our clinic or your PCP if symptoms return or worsen.

## 2021-01-21 NOTE — ED Provider Notes (Signed)
[facility]    CSN: [id number] Arrival date & time: 01/21/21  1023      History   Chief Complaint Chief Complaint  Patient presents with   Leg Swelling    HPI Jonathan Lyons is a 55 y.o. male.   Patient presents today with a several month history of intermittent leg swelling.  Reports that this occurs in both feet with radiation up to her knee following a day of work.  Reports that symptoms resolve with rest and do not occur if he is on his feet.  Since that he has also developed right foot pain that is from his heel into the plantar surface of his foot.  This has become persistent even on days that he is not working.  Pain is rated 6 on a 0-10 pain scale, described as aching, no aggravating relieving factors identified.  He denies any history of thyroid condition, chronic liver/kidney disease, heart failure.  He is prescribed amlodipine but reports that swelling has been going on prior to medication change.  He denies any dietary changes including increased sodium consumption.  He denies any shortness of breath, fatigue, lightheadedness.  Reports that he is taken several weeks off of work and leg swelling has resolved but he continues to have pain prompting evaluation.  He has tried over-the-counter orthotics without improvement.  He has not seen podiatrist in the past.  Symptoms are interfering with ability to perform daily duties.   Past Medical History:  Diagnosis Date   Essential hypertension 09/12/2017   Hypertension    Known health problems: none     Patient Active Problem List   Diagnosis Date Noted   Abnormal CT scan, cervical spine 02/14/2019   Neck pain 02/14/2019   Cervical strain, sequela 02/14/2019   Essential hypertension 09/12/2017    Past Surgical History:  Procedure Laterality Date   NO PAST SURGERIES         Home Medications    Prior to Admission medications   Medication Sig Start Date End Date Taking? Authorizing Provider  amLODipine  (NORVASC) 5 MG tablet Take 1 tablet (5 mg total) by mouth daily. 11/14/20  Yes Hoy Register, MD  hydrochlorothiazide (HYDRODIURIL) 25 MG tablet Take 25 mg by mouth daily.   Yes [provider]  losartan (COZAAR) 25 MG tablet Take 1 tablet (25 mg total) by mouth daily. 10/24/20 01/22/21 Yes Claiborne Rigg, NP  meloxicam (MOBIC) 15 MG tablet Take 1 tablet (15 mg total) by mouth daily. 01/21/21  Yes Jenniefer Salak K, PA-C  pantoprazole (PROTONIX) 40 MG tablet Take 1 tablet (40 mg total) by mouth daily. 09/09/20  Yes Claiborne Rigg, NP  cyclobenzaprine (FLEXERIL) 5 MG tablet Take 1 tablet (5 mg total) by mouth 2 (two) times daily as needed for muscle spasms. 09/09/20   Claiborne Rigg, NP  famotidine (PEPCID) 20 MG tablet Take 20 mg by mouth daily as needed for heartburn or indigestion.  07/04/19  [provider]    Family History Family History  Problem Relation Age of Onset   Hypertension Neg Hx    Diabetes Neg Hx    Cancer Neg Hx    Colon cancer Neg Hx    Colon polyps Neg Hx    Esophageal cancer Neg Hx    Rectal cancer Neg Hx    Stomach cancer Neg Hx     Social History Social History   Tobacco Use   Smoking status: Never   Smokeless tobacco: Never  Vaping Use   Vaping Use: Never used  Substance Use Topics   Alcohol use: No   Drug use: No     Allergies   Patient has no known allergies.   Review of Systems Review of Systems  Constitutional:  Positive for activity change. Negative for appetite change, fatigue and fever.  Respiratory:  Negative for cough and shortness of breath.   Cardiovascular:  Negative for chest pain.  Gastrointestinal:  Negative for abdominal pain, diarrhea, nausea and vomiting.  Musculoskeletal:  Positive for arthralgias and gait problem. Negative for joint swelling and myalgias.  Neurological:  Negative for dizziness, light-headedness and headaches.    Physical Exam Triage Vital Signs ED Triage Vitals  Enc Vitals Group     BP  01/21/21 1148 136/84     Pulse Rate 01/21/21 1148 (!) 50     Resp 01/21/21 1148 16     Temp 01/21/21 1148 98.2 F (36.8 C)     Temp Source 01/21/21 1148 Oral     SpO2 01/21/21 1148 100 %     Weight --      Height --      Head Circumference --      Peak Flow --      Pain Score 01/21/21 1153 6     Pain Loc --      Pain Edu? --      Excl. in GC? --    No data found.  Updated Vital Signs BP 136/84 (BP Location: Right Arm)    Pulse (!) 50    Temp 98.2 F (36.8 C) (Oral)    Resp 16    SpO2 100%   Visual Acuity Right Eye Distance:   Left Eye Distance:   Bilateral Distance:    Right Eye Near:   Left Eye Near:    Bilateral Near:     Physical Exam Vitals reviewed.  Constitutional:      General: He is awake.     Appearance: Normal appearance. He is well-developed. He is not ill-appearing.     Comments: Very pleasant male appears stated age in no acute distress sitting comfortably in exam room  HENT:     Head: Normocephalic and atraumatic.  Cardiovascular:     Rate and Rhythm: Normal rate and regular rhythm.     Pulses:          Posterior tibial pulses are 2+ on the right side and 2+ on the left side.     Heart sounds: Normal heart sounds, S1 normal and S2 normal. No murmur heard. Pulmonary:     Effort: Pulmonary effort is normal.     Breath sounds: Normal breath sounds. No stridor. No wheezing, rhonchi or rales.     Comments: Clear to auscultation bilaterally Musculoskeletal:     Right lower leg: No edema.     Left lower leg: No edema.     Right foot: Normal range of motion and normal capillary refill. Tenderness present. No swelling, deformity or bony tenderness.     Left foot: Normal range of motion and normal capillary refill. No swelling, deformity, tenderness or bony tenderness.     Comments: Right foot: No deformity noted.  Mild tenderness palpation over calcaneus and into plantar surface.  Normal gait.  Feet neurovascularly intact.  Neurological:     Mental Status:  He is alert.  Psychiatric:        Behavior: Behavior is cooperative.     UC Treatments / Results  Labs (all labs  ordered are listed, but only abnormal results are displayed) Labs Reviewed - No data to display  EKG   Radiology No results found.  Procedures Procedures (including critical care time)  Medications Ordered in UC Medications - No data to display  Initial Impression / Assessment and Plan / UC Course  I have reviewed the triage vital signs and the nursing notes.  Pertinent labs & imaging results that were available during my care of the patient were reviewed by me and considered in my medical decision making (see chart for details).     Patient has no significant edema on exam and reports this has improved since he has been off his feet.  Discussed that symptoms are likely related to dependent edema and encouraged him to use compression stockings to manage symptoms.  We did discuss the potential of laboratory work-up including TSH, CBC, CMP but given symptoms have improved and he is currently asymptomatic this was deferred.  Low suspicion for amlodipine contributing to symptoms given patient reports symptoms began prior to this medication change.  Suspect that foot pain is related to plantar fasciitis and will start anti-inflammatory medication.  He was prescribed Mobic 15 mg.  He was given contact information for podiatrist and discussed potential utility of seeing them for further evaluation including personalized orthotics should symptoms persist.  Recommended follow-up with PCP within a few weeks to ensure symptom improvement.  Discussed alarm symptoms that warrant emergent evaluation including increased pain, difficulty ambulating, swelling, shortness of breath, fatigue.  Strict return precautions given to which she expressed understanding.  Final Clinical Impressions(s) / UC Diagnoses   Final diagnoses:  Plantar fasciitis of right foot  Right foot pain  Dependent  edema     Discharge Instructions      I believe that you have plantar fasciitis causing your foot pain.  Start Mobic daily.  You should not take NSAIDs including aspirin, ibuprofen/Advil, naproxen/Aleve with this medication as it can cause stomach bleeding.  You can use Tylenol for breakthrough pain.  Wear supportive footwear and try to avoid prolonged standing.  I do recommend you follow-up with a podiatrist as we discussed.  Please call them to schedule an appointment.  You do not have any swelling today.  I believe the swelling is related to dependent edema as we discussed.  Please keep your feet elevated is much as possible, use compression stockings at work, avoid sodium.  If you do have any worsening symptoms including swelling that does not resolve, shortness of breath, nausea, vomiting, fatigue you need to be seen immediately.  Follow-up with either our clinic or your PCP if symptoms return or worsen.     ED Prescriptions     Medication Sig Dispense Auth. Provider   meloxicam (MOBIC) 15 MG tablet Take 1 tablet (15 mg total) by mouth daily. 14 tablet Printice Hellmer, Noberto Retort, PA-C      PDMP not reviewed this encounter.   Jeani Hawking, PA-C 01/21/21 1215

## 2021-01-21 NOTE — ED Triage Notes (Signed)
Patient c/o bilateral leg swelling x 2 months.   Patient endorses "pain at the bottom of my feet and upper on my leg".   Patient endorses swelling occurs after standing for long periods of time.   Patient has been taking hydrochlorothiazide with no relief of symptoms.

## 2021-01-23 ENCOUNTER — Ambulatory Visit: Payer: 59 | Admitting: Nurse Practitioner

## 2021-02-24 ENCOUNTER — Encounter: Payer: Self-pay | Admitting: Nurse Practitioner

## 2021-05-03 ENCOUNTER — Other Ambulatory Visit: Payer: Self-pay | Admitting: Nurse Practitioner

## 2021-05-03 DIAGNOSIS — I10 Essential (primary) hypertension: Secondary | ICD-10-CM

## 2021-05-05 NOTE — Telephone Encounter (Signed)
Requested medications are due for refill today.  yes ? ?Requested medications are on the active medications list.  yes ? ?Last refill. 99/23/2022 ? ?Future visit scheduled.   no ? ?Notes to clinic.  Rx expired 01/22/2021 ? ? ? ?Requested Prescriptions  ?Pending Prescriptions Disp Refills  ? losartan (COZAAR) 25 MG tablet [Pharmacy Med Name: LOSARTAN POTASSIUM 25 MG TAB] 90 tablet 1  ?  Sig: Take 1 tablet (25 mg total) by mouth daily.  ?  ? Cardiovascular:  Angiotensin Receptor Blockers Passed - 05/03/2021 12:05 AM  ?  ?  Passed - Cr in normal range and within 180 days  ?  Creatinine, Ser  ?Date Value Ref Range Status  ?11/14/2020 0.81 0.76 - 1.27 mg/dL Final  ?  ?  ?  ?  Passed - K in normal range and within 180 days  ?  Potassium  ?Date Value Ref Range Status  ?11/14/2020 4.0 3.5 - 5.2 mmol/L Final  ?  ?  ?  ?  Passed - Patient is not pregnant  ?  ?  Passed - Last BP in normal range  ?  BP Readings from Last 1 Encounters:  ?01/21/21 136/84  ?  ?  ?  ?  Passed - Valid encounter within last 6 months  ?  Recent Outpatient Visits   ? ?      ? 5 months ago Essential hypertension  ? [place], RPH-CPP  ? 6 months ago Essential hypertension  ? [place], [place] W, NP  ? 7 months ago Primary hypertension  ? [place], [place] W, NP  ? 1 year ago Spondylosis of cervical region without myelopathy or radiculopathy  ? [place], Charlane Ferretti, MD  ? 1 year ago Spondylosis of cervical region without myelopathy or radiculopathy  ? [place] Rakes, MD  ? ?  ?  ? ?  ?  ?  ?  ?

## 2021-07-22 ENCOUNTER — Ambulatory Visit: Payer: 59 | Admitting: Critical Care Medicine

## 2021-07-22 ENCOUNTER — Ambulatory Visit: Payer: Self-pay | Admitting: *Deleted

## 2021-07-22 VITALS — BP 148/92 | HR 51 | Temp 97.8°F | Resp 18 | Ht 71.0 in | Wt 189.0 lb

## 2021-07-22 DIAGNOSIS — M542 Cervicalgia: Secondary | ICD-10-CM | POA: Diagnosis not present

## 2021-07-22 DIAGNOSIS — I10 Essential (primary) hypertension: Secondary | ICD-10-CM

## 2021-07-22 DIAGNOSIS — R29898 Other symptoms and signs involving the musculoskeletal system: Secondary | ICD-10-CM

## 2021-07-22 DIAGNOSIS — R937 Abnormal findings on diagnostic imaging of other parts of musculoskeletal system: Secondary | ICD-10-CM | POA: Diagnosis not present

## 2021-07-22 MED ORDER — HYDROCHLOROTHIAZIDE 25 MG PO TABS: 25.0000 mg | ORAL_TABLET | Freq: Every day | ORAL | 2 refills | Status: DC

## 2021-07-22 MED ORDER — AMLODIPINE BESYLATE 5 MG PO TABS: 5.0000 mg | ORAL_TABLET | Freq: Every day | ORAL | 2 refills | Status: DC

## 2021-07-22 MED ORDER — VALSARTAN 80 MG PO TABS: 80.0000 mg | ORAL_TABLET | Freq: Every day | ORAL | 3 refills | Status: DC
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
## 2021-07-22 NOTE — Telephone Encounter (Signed)
Unable to reach at phone number(s) listed.  Voicemail not set up  Needs OV.

## 2021-07-22 NOTE — Telephone Encounter (Signed)
  Chief Complaint: hands freezing at nights, reports elevated BP Symptoms: reports BP 180/187. Did not check while on phone with NT. C/o hands freezing at nights. Has not taken prescribed medications x 1 month due to could not get refill or appt.  Frequency: 1 month  Pertinent Negatives: Patient denies any neurological deficits.  Disposition: [] ED /[x] Urgent Care (no appt availability in office) / [] Appointment(In office/virtual)/ []  [place] [facility]/ [] Home Care/ [] Refused Recommended Disposition /[x] [place] [facility]/ []  Follow-up with PCP Additional Notes:   Recommended to go to clinic to get BP checked and attempted to make appt for medication refills. Unsure if patient understood. Gave address for Mobile Bus today to check BP. Please advise and assist with refilling medication.   Reason for Disposition  [1] Systolic BP  >= 200 OR Diastolic >= 120 AND [2] having NO cardiac or neurologic symptoms  Answer Assessment - Initial Assessment Questions 1. BLOOD PRESSURE: "What is the blood pressure?" "Did you take at least two measurements 5 minutes apart?"     Unable to give measurements patient driving  2. ONSET: "When did you take your blood pressure?"     Did not state when taken but reported to agent BP 180/187. Patient reports BP machine is used and does not feel it is accurate 3. HOW: "How did you obtain the blood pressure?" (e.g., visiting nurse, automatic home BP monitor)     Home monitor  4. HISTORY: "Do you have a history of high blood pressure?"     Yes  5. MEDICATIONS: "Are you taking any medications for blood pressure?" "Have you missed any doses recently?"     Yes taking medications and ran out. Has not taken in greater than a month 6. OTHER SYMPTOMS: "Do you have any symptoms?" (e.g., headache, chest pain, blurred vision, difficulty breathing, weakness)     Hands freezing at night, denies any other sx 7. PREGNANCY: "Is there any chance you are pregnant?" "When was  your last menstrual period?"     na  Protocols used: Blood Pressure - High-A-AH

## 2021-07-22 NOTE — Progress Notes (Signed)
Patient has been out of BP medication for 1 month. Patient denies pain at this time. Patient reports L arm numbing increased at night while sleeping.

## 2021-07-22 NOTE — Patient Instructions (Signed)
Begin valsartan 1 pill daily and discontinue losartan for blood pressure  Begin amlodipine 1 pill daily for blood pressure  Begin hydrochlorothiazide 1 pill daily for blood pressure  MRI of the cervical spine will be ordered  Labs today include a metabolic panel  Appointment with Ms. Meredeth Ide will be made in the next 6 weeks

## 2021-07-23 ENCOUNTER — Telehealth: Payer: Self-pay

## 2021-07-23 DIAGNOSIS — R29898 Other symptoms and signs involving the musculoskeletal system: Secondary | ICD-10-CM | POA: Insufficient documentation

## 2021-07-23 LAB — BMP8+EGFR
BUN/Creatinine Ratio: 14 (ref 9–20)
BUN: 11 mg/dL (ref 6–24)
CO2: 23 mmol/L (ref 20–29)
Calcium: 9.3 mg/dL (ref 8.7–10.2)
Chloride: 105 mmol/L (ref 96–106)
Creatinine, Ser: 0.76 mg/dL (ref 0.76–1.27)
Glucose: 89 mg/dL (ref 70–99)
Potassium: 4.1 mmol/L (ref 3.5–5.2)
Sodium: 141 mmol/L (ref 134–144)
eGFR: 105 mL/min/{1.73_m2} (ref >59)

## 2021-07-23 NOTE — Assessment & Plan Note (Signed)
Neck pain symptoms consistent with cervical radiculopathy C-spine MRI ordered
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
## 2021-07-23 NOTE — Telephone Encounter (Signed)
Pt was called and no vm was left due to mailbox being full. Information has been sent to nurse pool.  

## 2021-07-23 NOTE — Assessment & Plan Note (Signed)
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

## 2021-07-23 NOTE — Assessment & Plan Note (Signed)
Patient currently out of all medications plan is to renew amlodipine 5 mg daily continue hydrochlorothiazide 25 mg daily and begin valsartan 80 mg daily  We will recheck renal function and GFR  We will ensure patient has close follow-up with Ms. Fleming next 6 weeks

## 2021-07-24 NOTE — Telephone Encounter (Addendum)
Patient states he was seen on mobile unit with Dr. Delford Field.  Aware of result to lab test and f/u appt.

## 2021-08-03 ENCOUNTER — Ambulatory Visit: Payer: 59 | Admitting: Nurse Practitioner

## 2021-09-24 ENCOUNTER — Encounter: Payer: Self-pay | Admitting: Physician Assistant

## 2021-09-24 ENCOUNTER — Ambulatory Visit: Payer: 59 | Attending: Physician Assistant | Admitting: Physician Assistant

## 2021-09-24 ENCOUNTER — Telehealth: Payer: Self-pay | Admitting: Nurse Practitioner

## 2021-09-24 VITALS — BP 149/79 | HR 53 | Ht 71.5 in | Wt 186.6 lb

## 2021-09-24 DIAGNOSIS — M47812 Spondylosis without myelopathy or radiculopathy, cervical region: Secondary | ICD-10-CM

## 2021-09-24 DIAGNOSIS — I10 Essential (primary) hypertension: Secondary | ICD-10-CM

## 2021-09-24 DIAGNOSIS — R29898 Other symptoms and signs involving the musculoskeletal system: Secondary | ICD-10-CM

## 2021-09-24 DIAGNOSIS — R42 Dizziness and giddiness: Secondary | ICD-10-CM | POA: Diagnosis not present

## 2021-09-24 MED ORDER — GABAPENTIN 300 MG PO CAPS: 300.0000 mg | ORAL_CAPSULE | Freq: Every day | ORAL | 3 refills | Status: DC

## 2021-09-24 NOTE — Patient Instructions (Signed)
Check blood pressures daily.  Write it down and bring those numbers with you to your next appt.  Bring your medications with you to your next appt.

## 2021-09-24 NOTE — Progress Notes (Signed)
Patient ID: Jonathan Lyons, male   DOB: 11-29-1965, 56 y.o.   MRN: [id number]   Jonathan Lyons, is a 56 y.o. male  GEX:[id number]  [id number]  DOB - Dec 13, 1965  Chief Complaint  Patient presents with   Numbness    Patient states to having numbness in both arms   Dizziness       Subjective:   Jonathan Lyons is a 56 y.o. male here today for continued B arm weakness and feelings of his arms being cold that awakens him at night and he has to shake his arms  out to get them to feel normal.  He denies any specific distribution.  Dr Delford Field had ordered an MRI of the c-spine after it was recommended on his CT scan 3 years ago and he never had it done.  Now there is an order in but appt has not been scheduled.    He has also been having dizziness at times when he stands up too fast and thinks one of his BP meds is causing it.  So, he stopped taking that BP med.  He says he is taking BP med but he can't tell me if it's one or 2 meds and he can't tell me which ones he is taking/is not taking.  He tries describing by the pill bottles but does not have the pill bottles with him.  He denies CP/SOB.  This has been going on for months.    No problems updated.  ALLERGIES: No Known Allergies  PAST MEDICAL HISTORY: Past Medical History:  Diagnosis Date   Essential hypertension 09/12/2017   Hypertension    Known health problems: none     MEDICATIONS AT HOME: Prior to Admission medications   Medication Sig Start Date End Date Taking? Authorizing Provider  gabapentin (NEURONTIN) 300 MG capsule Take 1 capsule (300 mg total) by mouth at bedtime. 09/24/21  Yes Georgian Co M, PA-C  amLODipine (NORVASC) 5 MG tablet Take 1 tablet (5 mg total) by mouth daily. Patient not taking: Reported on 09/24/2021 07/22/21   Storm Frisk, MD  hydrochlorothiazide (HYDRODIURIL) 25 MG tablet Take 1 tablet (25 mg total) by mouth daily. Patient not taking: Reported on 09/24/2021 07/22/21   Storm Frisk,  MD  valsartan (DIOVAN) 80 MG tablet Take 1 tablet (80 mg total) by mouth daily. Patient not taking: Reported on 09/24/2021 07/22/21   Storm Frisk, MD  famotidine (PEPCID) 20 MG tablet Take 20 mg by mouth daily as needed for heartburn or indigestion.  07/04/19  [provider]    ROS: Neg HEENT Neg resp Neg cardiac Neg GI Neg GU Neg psych  Objective:   Vitals:   09/24/21 1045  BP: (!) 149/79  Pulse: (!) 53  SpO2: 100%  Weight: 186 lb 9.6 oz (84.6 kg)  Height: 5' 11.5" (1.816 m)   Exam General appearance : Awake, alert, not in any distress. Speech Clear. Not toxic looking HEENT: Atraumatic and Normocephalic Neck: Supple, no JVD. No cervical lymphadenopathy.  Chest: Good air entry bilaterally, CTAB.  No rales/rhonchi/wheezing CVS: S1 S2 regular, no murmurs.  BUE; normal grip strength and ROM without detectable weakness.  UE DTR diminished B but equal.   Extremities: B/L Lower Ext shows no edema, both legs are warm to touch Neurology: Awake alert, and oriented X 3, CN II-XII intact, Non focal Skin: No Rash  Data Review No results found for: "HGBA1C"  Assessment & Plan   1. Spondylosis of cervical region without myelopathy or  radiculopathy MRI that was ordered in June now scheduled for 09/28/2021 - gabapentin (NEURONTIN) 300 MG capsule; Take 1 capsule (300 mg total) by mouth at bedtime.  Dispense: 90 capsule; Refill: 3  2. Bilateral arm weakness - gabapentin (NEURONTIN) 300 MG capsule; Take 1 capsule (300 mg total) by mouth at bedtime.  Dispense: 90 capsule; Refill: 3  3. Essential hypertension Check blood pressures daily.  Write it down and bring those numbers with you to your next appt.  Bring your medications with you to your next appt.  - Comprehensive metabolic panel - Thyroid Panel With TSH - CBC with Differential/Platelet  4. Dizziness Increase water intake-it sounds as though it occurs only when he stands up too quickly - Comprehensive metabolic  panel - Thyroid Panel With TSH - CBC with Differential/Platelet  Return for keep appt with Zelda in September.  The patient was given clear instructions to go to ER or return to medical center if symptoms don't improve, worsen or new problems develop. The patient verbalized understanding. The patient was told to call to get lab results if they haven't heard anything in the next week.      Georgian Co, PA-C [facility] [place], [place] [phone]   09/24/2021, 11:02 AM

## 2021-09-24 NOTE — Telephone Encounter (Signed)
Insurance information on file is no longer active.  Called patient to get Member ID number for his [employer] to fax PA.   PA faxed and  confirmation received.

## 2021-09-24 NOTE — Telephone Encounter (Signed)
Dawn calling from [facility] is calling to request Preauthorization for MRI of the spine.  Cb- [phone] [id number]

## 2021-09-25 LAB — COMPREHENSIVE METABOLIC PANEL
ALT: 18 IU/L (ref 0–44)
AST: 19 IU/L (ref 0–40)
Albumin/Globulin Ratio: 1.5 (ref 1.2–2.2)
Albumin: 4.3 g/dL (ref 3.8–4.9)
Alkaline Phosphatase: 70 IU/L (ref 44–121)
BUN/Creatinine Ratio: 21 — ABNORMAL HIGH (ref 9–20)
BUN: 19 mg/dL (ref 6–24)
Bilirubin Total: 0.3 mg/dL (ref 0.0–1.2)
CO2: 24 mmol/L (ref 20–29)
Calcium: 9.9 mg/dL (ref 8.7–10.2)
Chloride: 99 mmol/L (ref 96–106)
Creatinine, Ser: 0.89 mg/dL (ref 0.76–1.27)
Globulin, Total: 2.9 g/dL (ref 1.5–4.5)
Glucose: 92 mg/dL (ref 70–99)
Potassium: 3.6 mmol/L (ref 3.5–5.2)
Sodium: 141 mmol/L (ref 134–144)
Total Protein: 7.2 g/dL (ref 6.0–8.5)
eGFR: 100 mL/min/{1.73_m2} (ref >59)

## 2021-09-25 LAB — CBC WITH DIFFERENTIAL/PLATELET
Basophils Absolute: 0.1 10*3/uL (ref 0.0–0.2)
Basos: 1 %
EOS (ABSOLUTE): 0.1 10*3/uL (ref 0.0–0.4)
Eos: 2 %
Hematocrit: 41.2 % (ref 37.5–51.0)
Hemoglobin: 13.7 g/dL (ref 13.0–17.7)
Immature Grans (Abs): 0 10*3/uL (ref 0.0–0.1)
Immature Granulocytes: 1 %
Lymphocytes Absolute: 1.8 10*3/uL (ref 0.7–3.1)
Lymphs: 34 %
MCH: 28.4 pg (ref 26.6–33.0)
MCHC: 33.3 g/dL (ref 31.5–35.7)
MCV: 86 fL (ref 79–97)
Monocytes Absolute: 0.7 10*3/uL (ref 0.1–0.9)
Monocytes: 13 %
Neutrophils Absolute: 2.7 10*3/uL (ref 1.4–7.0)
Neutrophils: 49 %
Platelets: 232 10*3/uL (ref 150–450)
RBC: 4.82 x10E6/uL (ref 4.14–5.80)
RDW: 13 % (ref 11.6–15.4)
WBC: 5.5 10*3/uL (ref 3.4–10.8)

## 2021-09-25 LAB — THYROID PANEL WITH TSH
Free Thyroxine Index: 1.3 (ref 1.2–4.9)
T3 Uptake Ratio: 24 % (ref 24–39)
T4, Total: 5.6 ug/dL (ref 4.5–12.0)
TSH: 10.1 u[IU]/mL — ABNORMAL HIGH (ref 0.450–4.500)

## 2021-09-28 ENCOUNTER — Ambulatory Visit (HOSPITAL_COMMUNITY): Payer: 59

## 2021-09-29 ENCOUNTER — Telehealth: Payer: Self-pay | Admitting: Physician Assistant

## 2021-09-29 ENCOUNTER — Other Ambulatory Visit: Payer: Self-pay | Admitting: Physician Assistant

## 2021-09-29 DIAGNOSIS — R7989 Other specified abnormal findings of blood chemistry: Secondary | ICD-10-CM

## 2021-09-29 MED ORDER — LEVOTHYROXINE SODIUM 25 MCG PO TABS: 25.0000 ug | ORAL_TABLET | Freq: Every day | ORAL | 3 refills | Status: DC

## 2021-09-30 ENCOUNTER — Ambulatory Visit (HOSPITAL_COMMUNITY): Payer: 59

## 2021-09-30 ENCOUNTER — Telehealth: Payer: Self-pay | Admitting: *Deleted

## 2021-09-30 NOTE — Telephone Encounter (Addendum)
Attempt to call patient several times this afternoon regard apt for CT.   No answer and unable to leave voicemail.

## 2021-09-30 NOTE — Telephone Encounter (Signed)
Called patient but was unable to reach or leave voicemail

## 2021-10-07 ENCOUNTER — Ambulatory Visit: Payer: Self-pay | Admitting: Nurse Practitioner

## 2021-10-09 NOTE — Telephone Encounter (Signed)
Attempt to call patient again today to schedule CT and to see if there is any updated insurance information.  Unable to reach patient. Attempt to call patient at phone numbers listed but the voice mails are not set up or are full.

## 2021-10-21 ENCOUNTER — Encounter: Payer: Self-pay | Admitting: Nurse Practitioner

## 2021-10-21 ENCOUNTER — Ambulatory Visit: Payer: 59 | Attending: Nurse Practitioner | Admitting: Nurse Practitioner

## 2021-10-21 VITALS — BP 130/81 | HR 80 | Temp 98.0°F | Ht 71.0 in | Wt 183.0 lb

## 2021-10-21 DIAGNOSIS — R7989 Other specified abnormal findings of blood chemistry: Secondary | ICD-10-CM | POA: Diagnosis not present

## 2021-10-21 DIAGNOSIS — I10 Essential (primary) hypertension: Secondary | ICD-10-CM | POA: Diagnosis not present

## 2021-10-21 DIAGNOSIS — M47812 Spondylosis without myelopathy or radiculopathy, cervical region: Secondary | ICD-10-CM

## 2021-10-21 MED ORDER — AMLODIPINE BESYLATE 5 MG PO TABS: 5.0000 mg | ORAL_TABLET | Freq: Every day | ORAL | 1 refills | Status: DC

## 2021-10-21 MED ORDER — GABAPENTIN 300 MG PO CAPS: 300.0000 mg | ORAL_CAPSULE | Freq: Three times a day (TID) | ORAL | 3 refills | Status: DC

## 2021-10-21 MED ORDER — HYDROCHLOROTHIAZIDE 25 MG PO TABS: 25.0000 mg | ORAL_TABLET | Freq: Every day | ORAL | 1 refills | Status: DC

## 2021-10-21 MED ORDER — VALSARTAN 80 MG PO TABS: 80.0000 mg | ORAL_TABLET | Freq: Every day | ORAL | 1 refills | Status: DC
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
## 2021-10-22 LAB — T4, FREE: Free T4: 1.09 ng/dL (ref 0.82–1.77)

## 2021-10-22 LAB — T3, FREE: T3, Free: 2.9 pg/mL (ref 2.0–4.4)

## 2021-10-22 LAB — TSH: TSH: 0.97 u[IU]/mL (ref 0.450–4.500)

## 2022-01-20 ENCOUNTER — Ambulatory Visit: Payer: 59 | Admitting: Nurse Practitioner

## 2022-01-20 ENCOUNTER — Ambulatory Visit: Payer: Self-pay | Attending: Nurse Practitioner | Admitting: Physician Assistant

## 2022-01-20 DIAGNOSIS — R7989 Other specified abnormal findings of blood chemistry: Secondary | ICD-10-CM

## 2022-01-20 DIAGNOSIS — I10 Essential (primary) hypertension: Secondary | ICD-10-CM

## 2022-01-20 DIAGNOSIS — M47812 Spondylosis without myelopathy or radiculopathy, cervical region: Secondary | ICD-10-CM

## 2022-01-20 MED ORDER — LEVOTHYROXINE SODIUM 25 MCG PO TABS: 25.0000 ug | ORAL_TABLET | Freq: Every day | ORAL | 3 refills | Status: DC

## 2022-01-20 MED ORDER — HYDROCHLOROTHIAZIDE 25 MG PO TABS: 25.0000 mg | ORAL_TABLET | Freq: Every day | ORAL | 1 refills | Status: DC

## 2022-01-20 MED ORDER — AMLODIPINE BESYLATE 5 MG PO TABS: 5.0000 mg | ORAL_TABLET | Freq: Every day | ORAL | 1 refills | Status: DC

## 2022-01-20 MED ORDER — METHOCARBAMOL 500 MG PO TABS: 1000.0000 mg | ORAL_TABLET | Freq: Three times a day (TID) | ORAL | 1 refills | Status: DC | PRN

## 2022-01-20 MED ORDER — GABAPENTIN 300 MG PO CAPS: 300.0000 mg | ORAL_CAPSULE | Freq: Three times a day (TID) | ORAL | 3 refills | Status: DC

## 2022-01-20 MED ORDER — VALSARTAN 80 MG PO TABS: 80.0000 mg | ORAL_TABLET | Freq: Every day | ORAL | 1 refills | Status: DC

## 2022-01-20 NOTE — Progress Notes (Signed)
Jonathan Lyons, is a 56 y.o. male  EHU:[id number]  ZCH:[id number]  DOB - 02/21/1965  Chief Complaint  Patient presents with   Medication Refill   Hypertension       Subjective:   Jonathan Lyons is a 56 y.o. male here today for RF of meds.  Has npt taken BP meds in 2-3 days.  He says when he is taking all of them and check BP OOO it's usu ~130/80s.  Denies HA/CP/SOB.    Still having pain in neck and has not been able to get MRI that Dr Delford Field ordered due to insurance not covering.  He gets new insurance in January and will try again then.  Muscle rubs help.  He is requesting a muscle relaxer to help with this.  No UE weakness or paresthesias  No problems updated.  ALLERGIES: No Known Allergies  PAST MEDICAL HISTORY: Past Medical History:  Diagnosis Date   Essential hypertension 09/12/2017   Hypertension    Known health problems: none     MEDICATIONS AT HOME: Prior to Admission medications   Medication Sig Start Date End Date Taking? Authorizing Provider  methocarbamol (ROBAXIN) 500 MG tablet Take 2 tablets (1,000 mg total) by mouth every 8 (eight) hours as needed for muscle spasms. 01/20/22  Yes Georgian Co M, PA-C  amLODipine (NORVASC) 5 MG tablet Take 1 tablet (5 mg total) by mouth daily. 01/20/22   Anders Simmonds, PA-C  gabapentin (NEURONTIN) 300 MG capsule Take 1 capsule (300 mg total) by mouth 3 (three) times daily. 01/20/22   Anders Simmonds, PA-C  hydrochlorothiazide (HYDRODIURIL) 25 MG tablet Take 1 tablet (25 mg total) by mouth daily. 01/20/22   Anders Simmonds, PA-C  levothyroxine (SYNTHROID) 25 MCG tablet Take 1 tablet (25 mcg total) by mouth daily. 01/20/22   Anders Simmonds, PA-C  valsartan (DIOVAN) 80 MG tablet Take 1 tablet (80 mg total) by mouth daily. 01/20/22   Anders Simmonds, PA-C  famotidine (PEPCID) 20 MG tablet Take 20 mg by mouth daily as needed for heartburn or indigestion.  07/04/19  [provider]    ROS: Neg  HEENT Neg resp Neg cardiac Neg GI Neg GU Neg psych Neg neuro  Objective:   Vitals:   01/20/22 1556  BP: (!) 166/91  Pulse: (!) 52  SpO2: 97%  Weight: 192 lb (87.1 kg)   Exam General appearance : Awake, alert, not in any distress. Speech Clear. Not toxic looking HEENT: Atraumatic and Normocephalic Neck: Supple, no JVD. No cervical lymphadenopathy.  Chest: Good air entry bilaterally, CTAB.  No rales/rhonchi/wheezing CVS: S1 S2 regular, no murmurs.  No c-spine TTP-there is spasm in the R upper trapezius.  BUE DTR= intact Extremities: B/L Lower Ext shows no edema, both legs are warm to touch Neurology: Awake alert, and oriented X 3, CN II-XII intact, Non focal Skin: No Rash  Data Review No results found for: "HGBA1C"  Assessment & Plan   1. Essential hypertension Says BP is good OOO and when he is taking meds-he has not taken meds in 3 days.  Reviewed labs from 8&10/2021 - valsartan (DIOVAN) 80 MG tablet; Take 1 tablet (80 mg total) by mouth daily.  Dispense: 90 tablet; Refill: 1 - hydrochlorothiazide (HYDRODIURIL) 25 MG tablet; Take 1 tablet (25 mg total) by mouth daily.  Dispense: 90 tablet; Refill: 1 - amLODipine (NORVASC) 5 MG tablet; Take 1 tablet (5 mg total) by mouth daily.  Dispense: 90 tablet; Refill: 1  2. Spondylosis  of cervical region without myelopathy or radiculopathy - gabapentin (NEURONTIN) 300 MG capsule; Take 1 capsule (300 mg total) by mouth 3 (three) times daily.  Dispense: 90 capsule; Refill: 3 - methocarbamol (ROBAXIN) 500 MG tablet; Take 2 tablets (1,000 mg total) by mouth every 8 (eight) hours as needed for muscle spasms.  Dispense: 90 tablet; Refill: 1  3. Abnormal TSH Continue- - levothyroxine (SYNTHROID) 25 MCG tablet; Take 1 tablet (25 mcg total) by mouth daily.  Dispense: 90 tablet; Refill: 3  TSH,T3 and T 4 WNL/therapeutic 10/21/2021   Return in about 4 months (around 05/22/2022) for PCP for chronic conditions.  The patient was given clear  instructions to go to ER or return to medical center if symptoms don't improve, worsen or new problems develop. The patient verbalized understanding. The patient was told to call to get lab results if they haven't heard anything in the next week.      Georgian Co, PA-C [facility] [place], [place] [phone]   01/20/2022, 4:06 PMPatient ID: Jonathan Lyons, male   DOB: Apr 10, 1965, 56 y.o.   MRN: [id number]

## 2022-05-24 ENCOUNTER — Ambulatory Visit: Payer: BLUE CROSS/BLUE SHIELD | Attending: Nurse Practitioner | Admitting: Nurse Practitioner

## 2022-05-24 ENCOUNTER — Encounter: Payer: Self-pay | Admitting: Nurse Practitioner

## 2022-05-24 VITALS — BP 136/80 | HR 50 | Ht 71.0 in | Wt 188.8 lb

## 2022-05-24 DIAGNOSIS — I776 Arteritis, unspecified: Secondary | ICD-10-CM

## 2022-05-24 DIAGNOSIS — R7989 Other specified abnormal findings of blood chemistry: Secondary | ICD-10-CM

## 2022-05-24 DIAGNOSIS — I10 Essential (primary) hypertension: Secondary | ICD-10-CM

## 2022-05-24 DIAGNOSIS — M542 Cervicalgia: Secondary | ICD-10-CM

## 2022-05-24 DIAGNOSIS — Z1211 Encounter for screening for malignant neoplasm of colon: Secondary | ICD-10-CM

## 2022-05-24 DIAGNOSIS — Z0100 Encounter for examination of eyes and vision without abnormal findings: Secondary | ICD-10-CM

## 2022-05-24 MED ORDER — DICLOFENAC SODIUM 1 % EX GEL: 2.0000 g | Freq: Four times a day (QID) | CUTANEOUS | 3 refills | Status: DC

## 2022-05-24 MED ORDER — VALSARTAN 160 MG PO TABS: 160.0000 mg | ORAL_TABLET | Freq: Every day | ORAL | 1 refills | Status: DC

## 2022-05-24 MED ORDER — IBUPROFEN 600 MG PO TABS
600.0000 mg | ORAL_TABLET | Freq: Two times a day (BID) | ORAL | 0 refills | Status: DC | PRN
Start: 2022-05-24 — End: 2023-05-04

## 2022-05-24 NOTE — Progress Notes (Signed)
Assessment & Plan:  Jonathan Lyons was seen today for hypertension.  Diagnoses and all orders for this visit:  Primary hypertension STOP HCTZ and amlodipine -     valsartan (DIOVAN) 160 MG tablet; Take 1 tablet (160 mg total) by mouth daily. For blood pressure -     CMP14+EGFR  Colon cancer screening -     Ambulatory referral to Gastroenterology  Arteritis -     Sedimentation Rate -     C-reactive protein  Eye exam, routine -     Ambulatory referral to Ophthalmology  Abnormal TSH -     Thyroid Panel With TSH  Neck pain -     diclofenac Sodium (VOLTAREN) 1 % GEL; Apply 2 g topically 4 (four) times daily. To back of neck -     ibuprofen (ADVIL) 600 MG tablet; Take 1 tablet (600 mg total) by mouth every 12 (twelve) hours as needed. For headache    Patient has been counseled on age-appropriate routine health concerns for screening and prevention. These are reviewed and up-to-date. Referrals have been placed accordingly. Immunizations are up-to-date or declined.    Subjective:   Chief Complaint  Patient presents with   Hypertension   HPI Jonathan Lyons 57 y.o. male presents to office today with concerns of strong pulsating sensation/throbbing in bilateral temples. States blood pressure has also been high at home with SBP as high as 180s. Blood pressure is controlled today. Associated symptoms include dizziness. At this time we will dc his CCB and thiazide diuretic. Continue him on valsartan 160 mg only. Onset of headaches a few months ago. He has not tried any medication for this. He also has not had a recent eye exam.       BP Readings from Last 3 Encounters:  05/24/22 136/80  01/20/22 (!) 166/91  10/21/21 130/81     Review of Systems  Constitutional:  Negative for fever, malaise/fatigue and weight loss.  HENT: Negative.  Negative for nosebleeds.   Eyes: Negative.  Negative for blurred vision, double vision and photophobia.  Respiratory: Negative.  Negative for  cough and shortness of breath.   Cardiovascular: Negative.  Negative for chest pain, palpitations and leg swelling.  Gastrointestinal: Negative.  Negative for heartburn, nausea and vomiting.  Musculoskeletal:  Positive for neck pain. Negative for myalgias.  Neurological:  Positive for dizziness and headaches. Negative for focal weakness and seizures.  Psychiatric/Behavioral: Negative.  Negative for suicidal ideas.     Past Medical History:  Diagnosis Date   Essential hypertension 09/12/2017   Hypertension    Known health problems: none     Past Surgical History:  Procedure Laterality Date   NO PAST SURGERIES      Family History  Problem Relation Age of Onset   Hypertension Neg Hx    Diabetes Neg Hx    Cancer Neg Hx    Colon cancer Neg Hx    Colon polyps Neg Hx    Esophageal cancer Neg Hx    Rectal cancer Neg Hx    Stomach cancer Neg Hx     Social History Reviewed with no changes to be made today.   Outpatient Medications Prior to Visit  Medication Sig Dispense Refill   levothyroxine (SYNTHROID) 25 MCG tablet Take 1 tablet (25 mcg total) by mouth daily. 90 tablet 3   amLODipine (NORVASC) 5 MG tablet Take 1 tablet (5 mg total) by mouth daily. 90 tablet 1   gabapentin (NEURONTIN) 300 MG capsule Take 1 capsule (300  mg total) by mouth 3 (three) times daily. 90 capsule 3   hydrochlorothiazide (HYDRODIURIL) 25 MG tablet Take 1 tablet (25 mg total) by mouth daily. 90 tablet 1   methocarbamol (ROBAXIN) 500 MG tablet Take 2 tablets (1,000 mg total) by mouth every 8 (eight) hours as needed for muscle spasms. 90 tablet 1   valsartan (DIOVAN) 80 MG tablet Take 1 tablet (80 mg total) by mouth daily. 90 tablet 1   No facility-administered medications prior to visit.    No Known Allergies     Objective:    BP 136/80   Pulse (!) 50   Ht  (1.803 m)   Wt 188 lb 12.8 oz (85.6 kg)   SpO2 100%   BMI 26.33 kg/m  Wt Readings from Last 3 Encounters:  05/24/22 188 lb 12.8 oz  (85.6 kg)  01/20/22 192 lb (87.1 kg)  10/21/21 183 lb (83 kg)    Physical Exam Vitals and nursing note reviewed.  Constitutional:      Appearance: He is well-developed.  HENT:     Head: Normocephalic and atraumatic.  Cardiovascular:     Rate and Rhythm: Regular rhythm. Bradycardia present.     Heart sounds: Normal heart sounds. No murmur heard.    No friction rub. No gallop.  Pulmonary:     Effort: Pulmonary effort is normal. No tachypnea or respiratory distress.     Breath sounds: Normal breath sounds. No decreased breath sounds, wheezing, rhonchi or rales.  Chest:     Chest wall: No tenderness.  Abdominal:     General: Bowel sounds are normal.     Palpations: Abdomen is soft.  Musculoskeletal:        General: Normal range of motion.     Cervical back: Normal range of motion.  Skin:    General: Skin is warm and dry.  Neurological:     Mental Status: He is alert and oriented to person, place, and time.     Coordination: Coordination normal.  Psychiatric:        Behavior: Behavior normal. Behavior is cooperative.        Thought Content: Thought content normal.        Judgment: Judgment normal.          Patient has been counseled extensively about nutrition and exercise as well as the importance of adherence with medications and regular follow-up. The patient was given clear instructions to go to ER or return to medical center if symptoms don't improve, worsen or new problems develop. The patient verbalized understanding.   Follow-up: Return in about 11 days (around 06/04/2022) for May 3rd 1110 or 130 DB Blood pressure check/headache.   Claiborne Rigg, FNP-BC [facility] and [facility] [place], [place] [phone]   05/24/2022, 11:15 PM

## 2022-05-24 NOTE — Progress Notes (Signed)
Head is throbbing

## 2022-05-25 LAB — C-REACTIVE PROTEIN: CRP: <1 mg/L (ref 0–10)

## 2022-05-26 LAB — CMP14+EGFR
ALT: 16 IU/L (ref 0–44)
AST: 17 IU/L (ref 0–40)
Albumin/Globulin Ratio: 1.5 (ref 1.2–2.2)
Albumin: 4 g/dL (ref 3.8–4.9)
Alkaline Phosphatase: 72 IU/L (ref 44–121)
BUN/Creatinine Ratio: 12 (ref 9–20)
BUN: 12 mg/dL (ref 6–24)
Bilirubin Total: 0.2 mg/dL (ref 0.0–1.2)
CO2: 22 mmol/L (ref 20–29)
Calcium: 9 mg/dL (ref 8.7–10.2)
Chloride: 107 mmol/L — ABNORMAL HIGH (ref 96–106)
Creatinine, Ser: 1.01 mg/dL (ref 0.76–1.27)
Globulin, Total: 2.7 g/dL (ref 1.5–4.5)
Glucose: 84 mg/dL (ref 70–99)
Potassium: 4.4 mmol/L (ref 3.5–5.2)
Sodium: 141 mmol/L (ref 134–144)
Total Protein: 6.7 g/dL (ref 6.0–8.5)
eGFR: 86 mL/min/{1.73_m2} (ref >59)

## 2022-05-26 LAB — THYROID PANEL WITH TSH
Free Thyroxine Index: 1.4 (ref 1.2–4.9)
T3 Uptake Ratio: 24 % (ref 24–39)
T4, Total: 5.9 ug/dL (ref 4.5–12.0)
TSH: 12.4 u[IU]/mL — ABNORMAL HIGH (ref 0.450–4.500)

## 2022-05-26 LAB — SEDIMENTATION RATE: Sed Rate: 2 mm/hr (ref 0–30)

## 2022-05-31 ENCOUNTER — Other Ambulatory Visit: Payer: Self-pay | Admitting: Nurse Practitioner

## 2022-05-31 DIAGNOSIS — R7989 Other specified abnormal findings of blood chemistry: Secondary | ICD-10-CM

## 2022-07-03 DIAGNOSIS — Z419 Encounter for procedure for purposes other than remedying health state, unspecified: Secondary | ICD-10-CM | POA: Diagnosis not present

## 2022-07-08 ENCOUNTER — Telehealth: Payer: Self-pay

## 2022-07-08 ENCOUNTER — Ambulatory Visit: Payer: BLUE CROSS/BLUE SHIELD | Attending: Nurse Practitioner

## 2022-07-08 DIAGNOSIS — R7989 Other specified abnormal findings of blood chemistry: Secondary | ICD-10-CM

## 2022-07-08 NOTE — Telephone Encounter (Signed)
Patient came in requesting a call to explain recent lab results. confirmed phone number as well [phone]

## 2022-07-09 LAB — THYROID PANEL WITH TSH: T3 Uptake Ratio: 23 % — ABNORMAL LOW (ref 24–39)

## 2022-07-10 ENCOUNTER — Other Ambulatory Visit: Payer: Self-pay | Admitting: Nurse Practitioner

## 2022-07-10 DIAGNOSIS — R7989 Other specified abnormal findings of blood chemistry: Secondary | ICD-10-CM

## 2022-07-10 DIAGNOSIS — E039 Hypothyroidism, unspecified: Secondary | ICD-10-CM

## 2022-07-10 LAB — THYROID PANEL WITH TSH
Free Thyroxine Index: 1.1 — ABNORMAL LOW (ref 1.2–4.9)
T4, Total: 4.9 ug/dL (ref 4.5–12.0)
TSH: 14.1 u[IU]/mL — ABNORMAL HIGH (ref 0.450–4.500)

## 2022-07-10 MED ORDER — LEVOTHYROXINE SODIUM 50 MCG PO TABS
50.0000 ug | ORAL_TABLET | Freq: Every day | ORAL | 3 refills | Status: DC
Start: 2022-07-10 — End: 2022-08-27

## 2022-07-12 NOTE — Telephone Encounter (Signed)
Return call unanswered. Voicemail left to return call to our office.

## 2022-08-02 DIAGNOSIS — Z419 Encounter for procedure for purposes other than remedying health state, unspecified: Secondary | ICD-10-CM | POA: Diagnosis not present

## 2022-08-06 ENCOUNTER — Encounter: Payer: Self-pay | Admitting: Nurse Practitioner

## 2022-08-27 ENCOUNTER — Other Ambulatory Visit: Payer: Self-pay

## 2022-08-27 ENCOUNTER — Telehealth: Payer: Self-pay

## 2022-08-27 DIAGNOSIS — R7989 Other specified abnormal findings of blood chemistry: Secondary | ICD-10-CM

## 2022-08-27 MED ORDER — LEVOTHYROXINE SODIUM 50 MCG PO TABS
50.0000 ug | ORAL_TABLET | Freq: Every day | ORAL | 3 refills | Status: DC
Start: 2022-08-27 — End: 2022-09-29

## 2022-08-27 NOTE — Telephone Encounter (Signed)
Claiborne Rigg, NP 07/10/2022  7:52 AM EDT     Elevated thyroid levels. Thyroid Medication increased to 50 mg. Will need to return in 4-6 weeks for repeat labwork.   Pt called for lab results. Shared provider's note.  Pt states he is out of medication. I will place a refill request.

## 2022-08-27 NOTE — Telephone Encounter (Signed)
Requested Prescriptions  Pending Prescriptions Disp Refills   levothyroxine (SYNTHROID) 50 MCG tablet 30 tablet 3    Sig: Take 1 tablet (50 mcg total) by mouth daily.     Endocrinology:  Hypothyroid Agents Failed - 08/27/2022 12:03 PM      Failed - TSH in normal range and within 360 days    TSH  Date Value Ref Range Status  07/08/2022 14.100 (H) 0.450 - 4.500 uIU/mL Final         Passed - Valid encounter within last 12 months    Recent Outpatient Visits           3 months ago Primary hypertension   [facility] [place], Shea Stakes, NP   7 months ago Essential hypertension   [facility] [place], Marylene Land M, [place]   10 months ago Essential hypertension   [place] [facility] [place], [place] W, NP   11 months ago Spondylosis of cervical region without myelopathy or radiculopathy   [facility] [place], [place], [place]   1 year ago Essential hypertension   [facility] [place], Cornelius Moras, RPH-CPP

## 2022-09-02 DIAGNOSIS — Z419 Encounter for procedure for purposes other than remedying health state, unspecified: Secondary | ICD-10-CM | POA: Diagnosis not present

## 2022-09-27 ENCOUNTER — Ambulatory Visit: Payer: BLUE CROSS/BLUE SHIELD | Admitting: Physician Assistant

## 2022-09-27 ENCOUNTER — Encounter: Payer: Self-pay | Admitting: Physician Assistant

## 2022-09-27 VITALS — BP 134/79 | HR 63 | Ht 70.0 in | Wt 183.0 lb

## 2022-09-27 DIAGNOSIS — E039 Hypothyroidism, unspecified: Secondary | ICD-10-CM | POA: Diagnosis not present

## 2022-09-27 DIAGNOSIS — H5340 Unspecified visual field defects: Secondary | ICD-10-CM | POA: Diagnosis not present

## 2022-09-27 DIAGNOSIS — R937 Abnormal findings on diagnostic imaging of other parts of musculoskeletal system: Secondary | ICD-10-CM | POA: Diagnosis not present

## 2022-09-27 DIAGNOSIS — H539 Unspecified visual disturbance: Secondary | ICD-10-CM | POA: Diagnosis not present

## 2022-09-27 NOTE — Progress Notes (Signed)
Established Patient Office Visit  Subjective   Patient ID: Jonathan Lyons, male    DOB: 18-Dec-1965  Age: 57 y.o. MRN: [id number]  Chief Complaint  Patient presents with   Eye Problem    Pt describes as seeing something black affecting vision    States that he has been experiencing a loss of visual field in his right eye for approximately the past month.  States that he has noticed a black area when looking to his right.  States his right eye vision is also blurry.  Denies eye pain, or discharge.  Denies injury or trauma to eye  Has not tried anything for relief, does not wear glasses or contacts.  Does endorse that this was present approximately 1 year ago but went away on its own.    Also states today that he has continued neck pain.  States this has been chronic and had a CT completed in August 2020.  States that he was supposed to have an MRI completed for follow-up but unfortunately they kept getting canceled.      Past Medical History:  Diagnosis Date   Essential hypertension 09/12/2017   Hypertension    Known health problems: none    Social History   Socioeconomic History   Marital status: Married    Spouse name: Not on file   Number of children: 5   Years of education: Not on file   Highest education level: Not on file  Occupational History   Occupation: Photographer  Tobacco Use   Smoking status: Never   Smokeless tobacco: Never  Vaping Use   Vaping status: Never Used  Substance and Sexual Activity   Alcohol use: No   Drug use: No   Sexual activity: Not on file  Other Topics Concern   Not on file  Social History Narrative   ** Merged History Encounter **       Separated - 5 grown children 1 daughter 4 sons Company secretary  No EtOH, tobacco or drugs Emigrated from [place] decades ahgo   Social Determinants of Corporate investment banker Strain: Not on file  Food Insecurity: Not on file  Transportation Needs: Not on file  Physical Activity: Not  on file  Stress: Not on file  Social Connections: Not on file  Intimate Partner Violence: Not on file   Family History  Problem Relation Age of Onset   Hypertension Neg Hx    Diabetes Neg Hx    Cancer Neg Hx    Colon cancer Neg Hx    Colon polyps Neg Hx    Esophageal cancer Neg Hx    Rectal cancer Neg Hx    Stomach cancer Neg Hx    No Known Allergies  Review of Systems  Constitutional: Negative.   HENT: Negative.    Eyes:  Positive for blurred vision. Negative for photophobia, pain and discharge.  Respiratory:  Negative for shortness of breath.   Cardiovascular:  Negative for chest pain.  Gastrointestinal: Negative.   Genitourinary: Negative.   Musculoskeletal: Negative.   Skin: Negative.   Neurological: Negative.   Endo/Heme/Allergies: Negative.   Psychiatric/Behavioral: Negative.        Objective:     BP 134/79 (BP Location: Left Arm, Patient Position: Sitting, Cuff Size: Large)   Pulse 63   Ht 5\' 10"  (1.778 m)   Wt 183 lb (83 kg)   SpO2 100%   BMI 26.26 kg/m  BP Readings from Last 3 Encounters:  09/27/22 134/79  05/24/22 136/80  01/20/22 (!) 166/91   Wt Readings from Last 3 Encounters:  09/27/22 183 lb (83 kg)  05/24/22 188 lb 12.8 oz (85.6 kg)  01/20/22 192 lb (87.1 kg)      Physical Exam Vitals and nursing note reviewed.  Constitutional:      Appearance: Normal appearance.  HENT:     Head: Normocephalic.     Right Ear: External ear normal.     Left Ear: External ear normal.     Nose: Nose normal.     Mouth/Throat:     Mouth: Mucous membranes are moist.     Pharynx: Oropharynx is clear.  Eyes:     General:        Right eye: No discharge.        Left eye: No discharge.     Extraocular Movements: Extraocular movements intact.     Conjunctiva/sclera: Conjunctivae normal.     Pupils: Pupils are equal, round, and reactive to light.  Cardiovascular:     Rate and Rhythm: Normal rate and regular rhythm.     Pulses: Normal pulses.     Heart  sounds: Normal heart sounds.  Pulmonary:     Effort: Pulmonary effort is normal.     Breath sounds: Normal breath sounds.  Musculoskeletal:        General: Normal range of motion.     Cervical back: Normal range of motion and neck supple.  Skin:    General: Skin is warm.  Neurological:     General: No focal deficit present.     Mental Status: He is alert and oriented to person, place, and time.     Cranial Nerves: No cranial nerve deficit.  Psychiatric:        Mood and Affect: Mood normal.        Behavior: Behavior normal.        Thought Content: Thought content normal.        Judgment: Judgment normal.         Assessment & Plan:   Problem List Items Addressed This Visit       Other   Abnormal CT scan, cervical spine   Relevant Orders   MR Cervical Spine Wo Contrast   Other Visit Diagnoses     Visual field loss    -  Primary   Relevant Orders   Ambulatory referral to Ophthalmology   Change in vision       Relevant Orders   Ambulatory referral to Ophthalmology   Hypothyroidism, unspecified type       Relevant Orders   Thyroid Panel With TSH      1. Visual field loss Patient education given on supportive care, red flags given for prompt reevaluation - Ambulatory referral to Ophthalmology  2. Change in vision  - Ambulatory referral to Ophthalmology  3. Hypothyroidism, unspecified type Patient was overdue for labs to be completed, will forward results to primary care provider - Thyroid Panel With TSH  4. Abnormal CT scan, cervical spine Patient had abnormal CT scan August 2020.  IMPRESSION: 1. No acute cervical spine fracture. 2. Multilevel degenerative changes of the cervical spine. 3. Scattered lucent heterogeneity within the marrow cavity throughout the cervical vertebrae. Appearance could be seen in the setting of smoking related marrow changes or anemic processes. Marrow replacing disorders could also provide a similar appearance. Consider  further evaluation with MRI.     Electronically Signed   By: Kreg Shropshire M.D.   On: 09/27/2018  20:55  MRIs were previously scheduled, unsure of reason that they were not completed, last time MRI was scheduled was from office visit with Dr. Delford Field in June 2023.    - MR Cervical Spine Wo Contrast; Future   I have reviewed the patient's medical history (PMH, PSH, Social History, Family History, Medications, and allergies) , and have been updated if relevant. I spent 30 minutes reviewing chart and  face to face time with patient.    Return if symptoms worsen or fail to improve.    Kasandra Knudsen Mayers, PA-C

## 2022-09-27 NOTE — Patient Instructions (Signed)
We will call you with today's lab results.  I did start a referral for you to be seen by an eye specialist.  They will call you to schedule an appointment.  I will start a referral for you to have an MRI, the nurse will call you with your appointment.  Please let us know if there is anything else we can do for you.  Roney Jaffe, PA-C Physician Assistant [facility] [email or web address]   Visual Disturbances  A visual disturbance is any problem that interferes with your normal vision. This can affect one eye or both eyes. Some types of visual disturbances come and go without treatment and do not cause a permanent problem. Other visual disturbances may be a sign of an eye emergency or a medical emergency. Visual disturbances include: Blurred vision. Being unable to see certain colors. Being sensitive to light. Double vision in one eye or both eyes. Partial vision loss (visual field deficit). Being unaware of objects on one side of the body (visual spatial inattention). Rhythmic eye movements that you cannot control (nystagmus). Short-term or long-term blindness. Seeing: Floating spots or lines (floaters). Flashing or shimmering lights. Zigzagging lines or stars. The floor as tilted (visual midline shift). Things that are not really there (hallucinations). Causes of visual disturbances include: Dry eyes. Eye infection. The thin membrane at the back of the eye separating from the eyeball (retinal detachment). High blood pressure. Migraine. Glaucoma. Ischemic stroke. Cerebral aneurysm. Diabetes. It is important to get your eyes checked by a health care provider or eye care specialist (ophthalmologist or optometrist) as soon as possible to determine the cause of your visual disturbance. Follow these instructions at home: Take over-the-counter and prescription medicines only as told by your health care provider. Do not use  any products that contain nicotine or tobacco. These products include cigarettes, chewing tobacco, and vaping devices, such as e-cigarettes. If you need help quitting, ask your health care provider. To lower your risk of the problems that can lead to visual disturbances: Eat a balanced diet that includes fruits and vegetables, whole grains, lean meat, and low-fat dairy. Maintain a healthy weight. Work with your health care provider to lose weight if you need to. Exercise regularly. Ask your health care provider what activities are safe for you. Do not drive if you have trouble seeing. Ask your health care provider for guidance about when it is and is not safe for you to drive. Keep all follow-up visits. This is important. Contact a health care provider if: Your visual disturbance changes or becomes worse. Get help right away if:  You have new visual disturbances. You suddenly see flashing lights or floaters. You suddenly have a dark area in your field of vision, especially in the lower part. This can lead to a loss of central vision. You suddenly lose vision in one or both eyes. You have any symptoms of a stroke. "BE FAST" is an easy way to remember the main warning signs of a stroke: B - Balance. Signs are dizziness, sudden trouble walking, or loss of balance. E - Eyes. Signs are trouble seeing or a sudden change in vision. F - Face. Signs are sudden weakness or numbness of the face, or the face or eyelid drooping on one side. A - Arms. Signs are weakness or numbness in an arm. This happens suddenly and usually on one side of the body. S - Speech. Signs are sudden trouble speaking, slurred speech, or trouble understanding what people say.  T - Time. Time to call emergency services. Write down what time symptoms started. Have other signs of a stroke, such as: A sudden, severe headache with no known cause. Nausea or vomiting. A seizure. These symptoms may represent a serious problem that is  an emergency. Do not wait to see if the symptoms will go away. Get medical help right away. Call your local emergency services (911 in the [place] Do not drive yourself to the hospital. Summary A visual disturbance is any problem that interferes with your normal vision. It is important to get your eyes checked by a health care provider or eye care specialist to determine what kind of visual disturbance you have. Some visual disturbances may be a sign of an eye emergency or medical emergency. This information is not intended to replace advice given to you by your health care provider. Make sure you discuss any questions you have with your health care provider. Document Revised: 02/04/2022 Document Reviewed: 05/22/2020 Elsevier Patient Education  2024 [employer].

## 2022-09-28 LAB — THYROID PANEL WITH TSH
Free Thyroxine Index: 1.3 (ref 1.2–4.9)
T3 Uptake Ratio: 22 % — ABNORMAL LOW (ref 24–39)
T4, Total: 5.7 ug/dL (ref 4.5–12.0)
TSH: 6.88 u[IU]/mL — ABNORMAL HIGH (ref 0.450–4.500)

## 2022-09-29 ENCOUNTER — Other Ambulatory Visit: Payer: Self-pay | Admitting: Nurse Practitioner

## 2022-09-29 DIAGNOSIS — R7989 Other specified abnormal findings of blood chemistry: Secondary | ICD-10-CM

## 2022-09-29 MED ORDER — LEVOTHYROXINE SODIUM 75 MCG PO TABS: 75.0000 ug | ORAL_TABLET | Freq: Every day | ORAL | 1 refills | Status: DC

## 2022-10-01 ENCOUNTER — Telehealth: Payer: Medicaid Other | Admitting: Nurse Practitioner

## 2022-10-01 NOTE — Telephone Encounter (Signed)
Copied from CRM [id number]. Topic: General - Other >> Oct 01, 2022 12:18 PM Ja-Kwan M wrote: Reason for CRM: Dawn with [facility] reports that an authorization is needed for hte MRI that is scheduled for 10/04/22. Cb# [phone] Ext. [phone]

## 2022-10-03 DIAGNOSIS — Z419 Encounter for procedure for purposes other than remedying health state, unspecified: Secondary | ICD-10-CM | POA: Diagnosis not present

## 2022-10-04 ENCOUNTER — Ambulatory Visit (HOSPITAL_COMMUNITY): Payer: Medicaid Other

## 2022-10-06 ENCOUNTER — Telehealth: Payer: Self-pay

## 2022-10-06 NOTE — Telephone Encounter (Signed)
A referral for MRI has been placed for patient. Appointment has been scheduled numerous times.Patient states he has [employer], but no updated  insurance card have been upload to patients chart. A call was placed to patient to receive insurance information to start referral for MRI. Patient states he will give me a call once he gets to his desired location.

## 2022-10-12 ENCOUNTER — Telehealth: Payer: Self-pay

## 2022-10-12 NOTE — Telephone Encounter (Signed)
Follow up call placed to patient for updated insurance information to complete Prior Authorization- Reference number [id number] MRI/CAT Scan imaging Cervical Spine w/o contrast, placed by Cari, PA on 08.26.24. Patient states he was driving and unable to provide information at this time, awaiting a call back.

## 2022-10-17 ENCOUNTER — Other Ambulatory Visit: Payer: Self-pay | Admitting: Nurse Practitioner

## 2022-10-17 DIAGNOSIS — R7989 Other specified abnormal findings of blood chemistry: Secondary | ICD-10-CM

## 2022-10-18 NOTE — Telephone Encounter (Signed)
Unable to refill per protocol, Rx expired. Discontinued 08/27/22.  Requested Prescriptions  Pending Prescriptions Disp Refills   levothyroxine (SYNTHROID) 50 MCG tablet [Pharmacy Med Name: LEVOTHYROXINE 50 MCG TABLET] 90 tablet 1    Sig: TAKE 1 TABLET BY MOUTH EVERY DAY     Endocrinology:  Hypothyroid Agents Failed - 10/17/2022 12:15 AM      Failed - TSH in normal range and within 360 days    TSH  Date Value Ref Range Status  09/27/2022 6.880 (H) 0.450 - 4.500 uIU/mL Final         Passed - Valid encounter within last 12 months    Recent Outpatient Visits           4 months ago Primary hypertension   [place] [facility], Shea Stakes, NP   9 months ago Essential hypertension   [place] [facility] [place], Marylene Land [place], [place]   12 months ago Essential hypertension   [place] [facility] [place], [place], NP   1 year ago Spondylosis of cervical region without myelopathy or radiculopathy   [facility] [place], [place], [place]   1 year ago Essential hypertension   [facility] [place], Cornelius Moras, RPH-CPP

## 2022-10-23 ENCOUNTER — Other Ambulatory Visit: Payer: Self-pay | Admitting: Nurse Practitioner

## 2022-10-23 DIAGNOSIS — R7989 Other specified abnormal findings of blood chemistry: Secondary | ICD-10-CM

## 2022-10-28 ENCOUNTER — Ambulatory Visit: Payer: Medicaid Other | Attending: Nurse Practitioner

## 2022-10-28 DIAGNOSIS — R7989 Other specified abnormal findings of blood chemistry: Secondary | ICD-10-CM

## 2022-10-29 LAB — THYROID PANEL WITH TSH
Free Thyroxine Index: 1.8 (ref 1.2–4.9)
T3 Uptake Ratio: 27 % (ref 24–39)
T4, Total: 6.7 ug/dL (ref 4.5–12.0)
TSH: 4.49 u[IU]/mL (ref 0.450–4.500)

## 2022-11-02 DIAGNOSIS — Z419 Encounter for procedure for purposes other than remedying health state, unspecified: Secondary | ICD-10-CM | POA: Diagnosis not present

## 2022-11-06 ENCOUNTER — Other Ambulatory Visit: Payer: Self-pay | Admitting: Nurse Practitioner

## 2022-11-06 DIAGNOSIS — R7989 Other specified abnormal findings of blood chemistry: Secondary | ICD-10-CM

## 2022-11-06 MED ORDER — LEVOTHYROXINE SODIUM 75 MCG PO TABS
75.0000 ug | ORAL_TABLET | Freq: Every day | ORAL | 1 refills | Status: DC
Start: 2022-11-06 — End: 2023-08-01

## 2022-11-23 ENCOUNTER — Ambulatory Visit: Payer: Medicaid Other | Attending: Nurse Practitioner | Admitting: Nurse Practitioner

## 2022-12-03 DIAGNOSIS — Z419 Encounter for procedure for purposes other than remedying health state, unspecified: Secondary | ICD-10-CM | POA: Diagnosis not present

## 2022-12-09 ENCOUNTER — Ambulatory Visit: Payer: Medicaid Other | Attending: Physician Assistant | Admitting: Physician Assistant

## 2022-12-09 ENCOUNTER — Encounter: Payer: Self-pay | Admitting: Physician Assistant

## 2022-12-09 VITALS — BP 127/87 | HR 58 | Wt 185.6 lb

## 2022-12-09 DIAGNOSIS — M47812 Spondylosis without myelopathy or radiculopathy, cervical region: Secondary | ICD-10-CM | POA: Diagnosis not present

## 2022-12-09 DIAGNOSIS — I10 Essential (primary) hypertension: Secondary | ICD-10-CM

## 2022-12-09 DIAGNOSIS — R29898 Other symptoms and signs involving the musculoskeletal system: Secondary | ICD-10-CM

## 2022-12-09 DIAGNOSIS — R937 Abnormal findings on diagnostic imaging of other parts of musculoskeletal system: Secondary | ICD-10-CM

## 2022-12-09 DIAGNOSIS — M542 Cervicalgia: Secondary | ICD-10-CM | POA: Diagnosis not present

## 2022-12-09 MED ORDER — AMLODIPINE BESYLATE 10 MG PO TABS
10.0000 mg | ORAL_TABLET | Freq: Every day | ORAL | 1 refills | Status: DC
Start: 2022-12-09 — End: 2023-06-16

## 2022-12-09 MED ORDER — CYCLOBENZAPRINE HCL 5 MG PO TABS: 5.0000 mg | ORAL_TABLET | Freq: Three times a day (TID) | ORAL | 1 refills | Status: DC | PRN

## 2022-12-09 NOTE — Progress Notes (Signed)
Patient ID: Jonathan Lyons, male   DOB: 04-13-1965, 57 y.o.   MRN: [id number]   Jonathan Lyons, is a 57 y.o. male  WUJ:[id number]  NFA:[id number]  DOB - August 07, 1965  Chief Complaint  Patient presents with   Neck Pain       Subjective:   Jonathan Lyons is a 57 y.o. male here today bc he has been unable to get MRI.  It appears it was order to be done at the hospital then not scheduled bc at the time he did not have insurance.  He does have insurance now.  He continues to have neck pain and paresthesias with subjective weakness.  This has been ongoing but he has been unable to afford the MRI.    No problems updated.  ALLERGIES: No Known Allergies  PAST MEDICAL HISTORY: Past Medical History:  Diagnosis Date   Essential hypertension 09/12/2017   Hypertension    Known health problems: none     MEDICATIONS AT HOME: Prior to Admission medications   Medication Sig Start Date End Date Taking? Authorizing Provider  cyclobenzaprine (FLEXERIL) 5 MG tablet Take 1 tablet (5 mg total) by mouth 3 (three) times daily as needed for muscle spasms. 12/09/22  Yes Anders Simmonds, PA-C  levothyroxine (SYNTHROID) 75 MCG tablet Take 1 tablet (75 mcg total) by mouth daily. 11/06/22  Yes Claiborne Rigg, NP  amLODipine (NORVASC) 10 MG tablet Take 1 tablet (10 mg total) by mouth daily. 12/09/22   Anders Simmonds, PA-C  diclofenac Sodium (VOLTAREN) 1 % GEL Apply 2 g topically 4 (four) times daily. To back of neck Patient not taking: Reported on 09/27/2022 05/24/22   Claiborne Rigg, NP  ibuprofen (ADVIL) 600 MG tablet Take 1 tablet (600 mg total) by mouth every 12 (twelve) hours as needed. For headache Patient not taking: Reported on 09/27/2022 05/24/22   Claiborne Rigg, NP  valsartan (DIOVAN) 160 MG tablet Take 1 tablet (160 mg total) by mouth daily. For blood pressure Patient not taking: Reported on 12/09/2022 05/24/22   Claiborne Rigg, NP  famotidine (PEPCID) 20 MG tablet Take 20 mg by mouth  daily as needed for heartburn or indigestion.  07/04/19  [provider]    ROS: Neg HEENT Neg resp Neg cardiac Neg GI Neg GU Neg psych Neg neuro  Objective:   Vitals:   12/09/22 1437  BP: 127/87  Pulse: (!) 58  SpO2: 99%  Weight: 185 lb 9.6 oz (84.2 kg)   Exam General appearance : Awake, alert, not in any distress. Speech Clear. Not toxic looking HEENT: Atraumatic and Normocephalic Neck: Supple, no JVD. No cervical lymphadenopathy. ROM about 80% normal.  BUE DTR = intact but diminished.  Strength 4/5 BUE.  Normal grip strength Chest: Good air entry bilaterally, CTAB.  No rales/rhonchi/wheezing CVS: S1 S2 regular, no murmurs.  Extremities: B/L Lower Ext shows no edema, both legs are warm to touch Neurology: Awake alert, and oriented X 3, CN II-XII intact, Non focal Skin: No Rash  Data Review No results found for: "HGBA1C"  Assessment & Plan   1. Spondylosis of cervical region without myelopathy or radiculopathy - MR Cervical Spine Wo Contrast; Future - cyclobenzaprine (FLEXERIL) 5 MG tablet; Take 1 tablet (5 mg total) by mouth 3 (three) times daily as needed for muscle spasms.  Dispense: 30 tablet; Refill: 1  2. Bilateral arm weakness - MR Cervical Spine Wo Contrast; Future Ordered to GI  3. Neck pain - MR Cervical Spine Wo Contrast;  Future - cyclobenzaprine (FLEXERIL) 5 MG tablet; Take 1 tablet (5 mg total) by mouth 3 (three) times daily as needed for muscle spasms.  Dispense: 30 tablet; Refill: 1  4. Abnormal CT scan, cervical spine - MR Cervical Spine Wo Contrast; Future - cyclobenzaprine (FLEXERIL) 5 MG tablet; Take 1 tablet (5 mg total) by mouth 3 (three) times daily as needed for muscle spasms.  Dispense: 30 tablet; Refill: 1  5. Primary hypertension - amLODipine (NORVASC) 10 MG tablet; Take 1 tablet (10 mg total) by mouth daily.  Dispense: 90 tablet; Refill: 1  CT results from 2020: IMPRESSION: 1. No acute cervical spine fracture. 2. Multilevel  degenerative changes of the cervical spine. 3. Scattered lucent heterogeneity within the marrow cavity throughout the cervical vertebrae. Appearance could be seen in the setting of smoking related marrow changes or anemic processes. Marrow replacing disorders could also provide a similar appearance. Consider further evaluation with MRI.  Return in about 6 months (around 06/08/2023) for PCP for chronic conditions-zelda.  The patient was given clear instructions to go to ER or return to medical center if symptoms don't improve, worsen or new problems develop. The patient verbalized understanding. The patient was told to call to get lab results if they haven't heard anything in the next week.      Georgian Co, PA-C [facility] [place], [place] [phone]   12/09/2022, 3:11 PM

## 2022-12-27 ENCOUNTER — Encounter: Payer: Self-pay | Admitting: Physician Assistant

## 2022-12-29 ENCOUNTER — Encounter: Payer: Self-pay | Admitting: Physician Assistant

## 2023-01-02 DIAGNOSIS — Z419 Encounter for procedure for purposes other than remedying health state, unspecified: Secondary | ICD-10-CM | POA: Diagnosis not present

## 2023-01-03 ENCOUNTER — Other Ambulatory Visit: Payer: Self-pay

## 2023-01-03 DIAGNOSIS — I10 Essential (primary) hypertension: Secondary | ICD-10-CM

## 2023-01-03 MED ORDER — VALSARTAN 160 MG PO TABS: 160.0000 mg | ORAL_TABLET | Freq: Every day | ORAL | 1 refills | Status: DC

## 2023-01-07 ENCOUNTER — Ambulatory Visit
Admission: RE | Admit: 2023-01-07 | Discharge: 2023-01-07 | Disposition: A | Discharge status: Home / Self Care | Payer: Medicaid Other | Source: Ambulatory Visit | Attending: Physician Assistant | Admitting: Physician Assistant

## 2023-01-07 DIAGNOSIS — R202 Paresthesia of skin: Secondary | ICD-10-CM | POA: Diagnosis not present

## 2023-01-07 DIAGNOSIS — M542 Cervicalgia: Secondary | ICD-10-CM | POA: Diagnosis not present

## 2023-01-07 DIAGNOSIS — G8929 Other chronic pain: Secondary | ICD-10-CM | POA: Diagnosis not present

## 2023-01-07 DIAGNOSIS — M47812 Spondylosis without myelopathy or radiculopathy, cervical region: Secondary | ICD-10-CM

## 2023-01-07 DIAGNOSIS — R29898 Other symptoms and signs involving the musculoskeletal system: Secondary | ICD-10-CM

## 2023-01-07 DIAGNOSIS — R937 Abnormal findings on diagnostic imaging of other parts of musculoskeletal system: Secondary | ICD-10-CM

## 2023-01-12 DIAGNOSIS — H04123 Dry eye syndrome of bilateral lacrimal glands: Secondary | ICD-10-CM | POA: Diagnosis not present

## 2023-01-12 DIAGNOSIS — H40013 Open angle with borderline findings, low risk, bilateral: Secondary | ICD-10-CM | POA: Diagnosis not present

## 2023-01-12 DIAGNOSIS — H43811 Vitreous degeneration, right eye: Secondary | ICD-10-CM | POA: Diagnosis not present

## 2023-01-12 DIAGNOSIS — H25813 Combined forms of age-related cataract, bilateral: Secondary | ICD-10-CM | POA: Diagnosis not present

## 2023-01-17 ENCOUNTER — Other Ambulatory Visit: Payer: Self-pay | Admitting: Physician Assistant

## 2023-01-17 DIAGNOSIS — M542 Cervicalgia: Secondary | ICD-10-CM

## 2023-01-17 DIAGNOSIS — R937 Abnormal findings on diagnostic imaging of other parts of musculoskeletal system: Secondary | ICD-10-CM

## 2023-01-17 DIAGNOSIS — M47812 Spondylosis without myelopathy or radiculopathy, cervical region: Secondary | ICD-10-CM

## 2023-01-17 DIAGNOSIS — R29898 Other symptoms and signs involving the musculoskeletal system: Secondary | ICD-10-CM

## 2023-01-18 ENCOUNTER — Telehealth: Payer: Self-pay

## 2023-01-18 NOTE — Telephone Encounter (Signed)
-----   Message from Georgian Co sent at 01/17/2023  8:22 AM EST ----- Please call patient.  MRI of neck shows a lot of degenerative changes, narrowing and stiffness of the spine in the neck.  I am referring him to both orthopedics and neurosurgery to see what all options he has for treatments and possibly surgery.  Thanks, Georgian Co, PA-C

## 2023-01-18 NOTE — Telephone Encounter (Signed)
Pt was called and vm was left, Information has been sent to nurse pool.

## 2023-01-19 ENCOUNTER — Telehealth: Payer: Self-pay | Admitting: Orthopedic Surgery

## 2023-01-19 NOTE — Telephone Encounter (Signed)
Mailed reminder letter to patient today 

## 2023-01-20 ENCOUNTER — Telehealth: Payer: Self-pay

## 2023-01-20 NOTE — Telephone Encounter (Signed)
Pt was called and vm was left, Information has been sent to nurse pool.

## 2023-01-20 NOTE — Telephone Encounter (Signed)
-----   Message from Georgian Co sent at 01/17/2023  8:22 AM EST ----- Please call patient.  MRI of neck shows a lot of degenerative changes, narrowing and stiffness of the spine in the neck.  I am referring him to both orthopedics and neurosurgery to see what all options he has for treatments and possibly surgery.  Thanks, Georgian Co, PA-C

## 2023-02-02 DIAGNOSIS — Z419 Encounter for procedure for purposes other than remedying health state, unspecified: Secondary | ICD-10-CM | POA: Diagnosis not present

## 2023-02-04 ENCOUNTER — Other Ambulatory Visit (INDEPENDENT_AMBULATORY_CARE_PROVIDER_SITE_OTHER): Payer: Self-pay

## 2023-02-04 ENCOUNTER — Ambulatory Visit (INDEPENDENT_AMBULATORY_CARE_PROVIDER_SITE_OTHER): Payer: Medicaid Other | Admitting: Orthopedic Surgery

## 2023-02-04 VITALS — BP 153/87 | HR 52 | Ht 70.0 in | Wt 186.0 lb

## 2023-02-04 DIAGNOSIS — M542 Cervicalgia: Secondary | ICD-10-CM | POA: Diagnosis not present

## 2023-02-04 DIAGNOSIS — M5412 Radiculopathy, cervical region: Secondary | ICD-10-CM | POA: Diagnosis not present

## 2023-02-04 MED ORDER — METHYLPREDNISOLONE 4 MG PO TBPK: ORAL_TABLET | ORAL | 0 refills | Status: DC
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
## 2023-03-05 DIAGNOSIS — Z419 Encounter for procedure for purposes other than remedying health state, unspecified: Secondary | ICD-10-CM | POA: Diagnosis not present

## 2023-03-07 ENCOUNTER — Ambulatory Visit: Payer: Medicaid Other | Admitting: Orthopedic Surgery

## 2023-03-07 ENCOUNTER — Telehealth: Payer: Self-pay | Admitting: Nurse Practitioner

## 2023-03-07 DIAGNOSIS — M5412 Radiculopathy, cervical region: Secondary | ICD-10-CM

## 2023-03-07 MED ORDER — GABAPENTIN 300 MG PO CAPS: 300.0000 mg | ORAL_CAPSULE | Freq: Three times a day (TID) | ORAL | 0 refills | Status: DC
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
## 2023-03-07 NOTE — Telephone Encounter (Signed)
Pt came in this morning requesting refills on VALSARTAN AND AMLODIPINE

## 2023-03-21 ENCOUNTER — Ambulatory Visit (HOSPITAL_COMMUNITY)
Admission: EM | Admit: 2023-03-21 | Discharge: 2023-03-21 | Disposition: A | Discharge status: Home / Self Care | Payer: Medicaid Other

## 2023-03-21 ENCOUNTER — Encounter (HOSPITAL_COMMUNITY): Payer: Self-pay

## 2023-03-21 DIAGNOSIS — J069 Acute upper respiratory infection, unspecified: Secondary | ICD-10-CM | POA: Diagnosis not present

## 2023-03-21 NOTE — Discharge Instructions (Addendum)

## 2023-03-21 NOTE — ED Triage Notes (Signed)
Patient c/o a non productive cough and a headache x 3 days.

## 2023-03-21 NOTE — ED Provider Notes (Signed)
[facility]    CSN: [id number] Arrival date & time: 03/21/23  4098      History   Chief Complaint Chief Complaint  Patient presents with   Cough   Headache    HPI Jonathan Lyons is a 58 y.o. male.   HPI  He reports he has had cold symptoms for about 3 days He report dry cough with sore throat, headaches, ear fullness  Interventions: nothing He denies known sick contacts recently     Past Medical History:  Diagnosis Date   Essential hypertension 09/12/2017   Hypertension    Known health problems: none     Patient Active Problem List   Diagnosis Date Noted   Bilateral arm weakness 07/23/2021   Abnormal CT scan, cervical spine 02/14/2019   Neck pain 02/14/2019   Essential hypertension 09/12/2017    Past Surgical History:  Procedure Laterality Date   NO PAST SURGERIES         Home Medications    Prior to Admission medications   Medication Sig Start Date End Date Taking? Authorizing Provider  amLODipine (NORVASC) 10 MG tablet Take 1 tablet (10 mg total) by mouth daily. 12/09/22   Anders Simmonds, PA-C  cyclobenzaprine (FLEXERIL) 5 MG tablet Take 1 tablet (5 mg total) by mouth 3 (three) times daily as needed for muscle spasms. 12/09/22   Anders Simmonds, PA-C  diclofenac Sodium (VOLTAREN) 1 % GEL Apply 2 g topically 4 (four) times daily. To back of neck Patient not taking: Reported on 09/27/2022 05/24/22   Claiborne Rigg, NP  gabapentin (NEURONTIN) 300 MG capsule Take 1 capsule (300 mg total) by mouth 3 (three) times daily. 03/07/23   London Sheer, MD  ibuprofen (ADVIL) 600 MG tablet Take 1 tablet (600 mg total) by mouth every 12 (twelve) hours as needed. For headache Patient not taking: Reported on 09/27/2022 05/24/22   Claiborne Rigg, NP  levothyroxine (SYNTHROID) 75 MCG tablet Take 1 tablet (75 mcg total) by mouth daily. 11/06/22   Claiborne Rigg, NP  methylPREDNISolone (MEDROL DOSEPAK) 4 MG TBPK tablet Take as prescribed on the box  02/04/23   London Sheer, MD  valsartan (DIOVAN) 160 MG tablet Take 1 tablet (160 mg total) by mouth daily. For blood pressure 01/03/23   Claiborne Rigg, NP  famotidine (PEPCID) 20 MG tablet Take 20 mg by mouth daily as needed for heartburn or indigestion.  07/04/19  [provider]    Family History Family History  Problem Relation Age of Onset   Hypertension Neg Hx    Diabetes Neg Hx    Cancer Neg Hx    Colon cancer Neg Hx    Colon polyps Neg Hx    Esophageal cancer Neg Hx    Rectal cancer Neg Hx    Stomach cancer Neg Hx     Social History Social History   Tobacco Use   Smoking status: Never   Smokeless tobacco: Never  Vaping Use   Vaping status: Never Used  Substance Use Topics   Alcohol use: No   Drug use: No     Allergies   Patient has no known allergies.   Review of Systems Review of Systems  Constitutional:  Positive for fatigue. Negative for chills and fever.  HENT:  Positive for congestion, ear pain, rhinorrhea, sinus pressure and sore throat.   Respiratory:  Positive for cough. Negative for shortness of breath and wheezing.   Gastrointestinal:  Negative for diarrhea,  nausea and vomiting.  Musculoskeletal:  Positive for myalgias.  Neurological:  Positive for headaches.     Physical Exam Triage Vital Signs ED Triage Vitals [03/21/23 1016]  Encounter Vitals Group     BP (!) 144/71     Systolic BP Percentile      Diastolic BP Percentile      Pulse Rate (!) 51     Resp 16     Temp 98.5 F (36.9 C)     Temp Source Oral     SpO2 97 %     Weight      Height      Head Circumference      Peak Flow      Pain Score 7     Pain Loc      Pain Education      Exclude from Growth Chart    No data found.  Updated Vital Signs BP (!) 144/71 (BP Location: Left Arm)   Pulse (!) 51   Temp 98.5 F (36.9 C) (Oral)   Resp 16   SpO2 97%   Visual Acuity Right Eye Distance:   Left Eye Distance:   Bilateral Distance:    Right Eye Near:   Left  Eye Near:    Bilateral Near:     Physical Exam Vitals reviewed.  Constitutional:      General: He is awake.     Appearance: Normal appearance. He is well-developed and well-groomed.  HENT:     Head: Normocephalic and atraumatic.     Right Ear: Hearing, tympanic membrane and ear canal normal.     Left Ear: Hearing, tympanic membrane and ear canal normal.     Mouth/Throat:     Lips: Pink.     Mouth: Mucous membranes are moist.     Pharynx: Oropharynx is clear. Uvula midline. No pharyngeal swelling, oropharyngeal exudate, posterior oropharyngeal erythema, uvula swelling or postnasal drip.     Tonsils: No tonsillar exudate or tonsillar abscesses.  Eyes:     General: Lids are normal. Gaze aligned appropriately.     Extraocular Movements: Extraocular movements intact.  Cardiovascular:     Rate and Rhythm: Normal rate and regular rhythm.     Heart sounds: Normal heart sounds.  Pulmonary:     Effort: Pulmonary effort is normal.     Breath sounds: Normal breath sounds. No decreased air movement. No decreased breath sounds, wheezing, rhonchi or rales.  Musculoskeletal:     Cervical back: Normal range of motion and neck supple.  Lymphadenopathy:     Head:     Right side of head: No submental, submandibular or preauricular adenopathy.     Left side of head: No submental, submandibular or preauricular adenopathy.     Cervical:     Right cervical: No superficial cervical adenopathy.    Left cervical: No superficial cervical adenopathy.     Upper Body:     Right upper body: No supraclavicular adenopathy.     Left upper body: No supraclavicular adenopathy.  Skin:    General: Skin is warm and dry.  Neurological:     General: No focal deficit present.     Mental Status: He is alert and oriented to person, place, and time.  Psychiatric:        Mood and Affect: Mood normal.        Speech: Speech normal.        Behavior: Behavior normal. Behavior is cooperative.        Thought  Content:  Thought content normal.        Judgment: Judgment normal.      UC Treatments / Results  Labs (all labs ordered are listed, but only abnormal results are displayed) Labs Reviewed - No data to display  EKG   Radiology No results found.  Procedures Procedures (including critical care time)  Medications Ordered in UC Medications - No data to display  Initial Impression / Assessment and Plan / UC Course  I have reviewed the triage vital signs and the nursing notes.  Pertinent labs & imaging results that were available during my care of the patient were reviewed by me and considered in my medical decision making (see chart for details).      Final Clinical Impressions(s) / UC Diagnoses   Final diagnoses:  Viral upper respiratory tract infection   Visit with patient indicates symptoms comprised of sore throat, dry cough, fatigue for the past 3 days  congruent with acute URI that is likely viral in nature  Patient is outside therapeutic window for antivirals- no flu or COVID testing performed today.  Due to nature and duration of symptoms recommended treatment regimen is symptomatic relief and follow up if needed Discussed with patient the various viral and bacterial etiologies of current illness and appropriate course of treatment Discussed OTC medication options for multisymptom relief such as Robitussin, Mucinex, Tylenol  Discussed return precautions if symptoms are not improving or worsen over next 5-7 days.       Discharge Instructions      Based on your described symptoms and the duration of symptoms it is likely that you have a viral upper respiratory infection (often called a "cold")  Symptoms can last for 3-10 days with lingering cough and intermittent symptoms lasting weeks after that.  The goal of treatment at this time is to reduce your symptoms and discomfort   I recommend using Robitussin and Mucinex (regular formulations, nothing with decongestants or  DM)  You can also use Tylenol for body aches and fever reduction I also recommend adding an antihistamine to your daily regimen This includes medications like Claritin, Allegra, Zyrtec- the generics of these work very well and are usually less expensive I recommend using Flonase nasal spray - 2 puffs twice per day to help with your nasal congestion The antihistamines and Flonase can take a few weeks to provide significant relief from allergy symptoms but should start to provide some benefit soon. You can use a humidifier at night to help with preventing nasal dryness and irritation   If your symptoms do not improve or become worse in the next 5-7 days please make an apt at the office so we can see you  Go to the ER if you begin to have more serious symptoms such as shortness of breath, trouble breathing, loss of consciousness, swelling around the eyes, high fever, severe lasting headaches, vision changes or neck pain/stiffness.       ED Prescriptions   None    PDMP not reviewed this encounter.   Julius Boniface, Oswaldo Conroy, PA-C 03/21/23 1038

## 2023-03-31 ENCOUNTER — Other Ambulatory Visit: Payer: Self-pay

## 2023-03-31 ENCOUNTER — Ambulatory Visit: Payer: Medicaid Other | Admitting: Physical Medicine and Rehabilitation

## 2023-03-31 VITALS — BP 162/90 | HR 51

## 2023-03-31 DIAGNOSIS — M5412 Radiculopathy, cervical region: Secondary | ICD-10-CM | POA: Diagnosis not present

## 2023-03-31 MED ORDER — METHYLPREDNISOLONE ACETATE 40 MG/ML IJ SUSP
40.0000 mg | Freq: Once | INTRAMUSCULAR | Status: AC
  Administered 2023-03-31: 40 mg

## 2023-03-31 NOTE — Progress Notes (Signed)
 Jonathan Lyons - 58 y.o. male MRN [id number]  Date of birth: 1965-07-10  Office Visit Note: Visit Date: 03/31/2023 PCP: Claiborne Rigg, NP Referred by: London Sheer, MD  Subjective: Chief Complaint  Patient presents with   Neck - Pain   HPI:  Jonathan Lyons is a 58 y.o. male who comes in today at the request of Dr. Willia Craze for planned Right C7-T1 Cervical Interlaminar epidural steroid injection with fluoroscopic guidance.  The patient has failed conservative care including home exercise, medications, time and activity modification.  This injection will be diagnostic and hopefully therapeutic.  Please see requesting physician notes for further details and justification.   ROS Otherwise per HPI.  Assessment & Plan: Visit Diagnoses:    ICD-10-CM   1. Cervical radiculopathy  M54.12 XR C-ARM NO REPORT    Epidural Steroid injection    methylPREDNISolone acetate (DEPO-MEDROL) injection 40 mg      Plan: No additional findings.   Meds & Orders:  Meds ordered this encounter  Medications   methylPREDNISolone acetate (DEPO-MEDROL) injection 40 mg    Orders Placed This Encounter  Procedures   XR C-ARM NO REPORT   Epidural Steroid injection    Follow-up: Return for visit to requesting provider as needed.   Procedures: No procedures performed  Cervical Epidural Steroid Injection - Interlaminar Approach with Fluoroscopic Guidance  Patient: Jonathan Lyons      Date of Birth: [birth year] MRN: [id number] PCP: Claiborne Rigg, NP      Visit Date: 03/31/2023   Universal Protocol:    Date/Time: 02/27/258:45 AM  Consent Given By: the patient  Position: PRONE  Additional Comments: Vital signs were monitored before and after the procedure. Patient was prepped and draped in the usual sterile fashion. The correct patient, procedure, and site was verified.   Injection Procedure Details:   Procedure diagnoses: Cervical radiculopathy [M54.12]    Meds  Administered:  Meds ordered this encounter  Medications   methylPREDNISolone acetate (DEPO-MEDROL) injection 40 mg     Laterality: Right  Location/Site: C7-T1  Needle: 3.5 in., 20 ga. Tuohy  Needle Placement: Paramedian epidural space  Findings:  -Comments: Excellent flow of contrast into the epidural space.  Procedure Details: Using a paramedian approach from the side mentioned above, the region overlying the inferior lamina was localized under fluoroscopic visualization and the soft tissues overlying this structure were infiltrated with 4 ml. of 1% Lidocaine without Epinephrine. A # 20 gauge, Tuohy needle was inserted into the epidural space using a paramedian approach.  The epidural space was localized using loss of resistance along with contralateral oblique bi-planar fluoroscopic views.  After negative aspirate for air, blood, and CSF, a 2 ml. volume of Isovue-250 was injected into the epidural space and the flow of contrast was observed. Radiographs were obtained for documentation purposes.   The injectate was administered into the level noted above.  Additional Comments:  No complications occurred Dressing: 2 x 2 sterile gauze and Band-Aid    Post-procedure details: Patient was observed during the procedure. Post-procedure instructions were reviewed.  Patient left the clinic in stable condition.   Clinical History: MRI CERVICAL SPINE WITHOUT CONTRAST   TECHNIQUE: Multiplanar, multisequence MR imaging of the cervical spine was performed. No intravenous contrast was administered.   COMPARISON:  Prior CT from 09/27/2018.   FINDINGS: Alignment: Straightening with reversal of the normal upper cervical lordosis. Trace degenerative retrolisthesis of C3 on C4 and C4 on C5.   Vertebrae: Vertebral body height maintained  without acute or chronic fracture. Bone marrow signal intensity mildly heterogeneous but overall within normal limits. No worrisome osseous lesions.  No abnormal marrow edema.   Cord: Normal signal and morphology.   Posterior Fossa, vertebral arteries, paraspinal tissues: Visualized brain and posterior fossa within normal limits. Craniocervical junction normal. Paraspinous and prevertebral soft tissues within normal limits. Normal intravascular flow voids seen within the vertebral arteries bilaterally.   Disc levels:   C2-C3: Degenerative intervertebral disc space narrowing with diffuse disc bulge. Associated right greater than left uncovertebral spurring. Flattening and partial effacement of the ventral thecal sac with resultant mild to moderate spinal stenosis. Mild right C3 foraminal narrowing. Left neural foramina remains patent.   C3-C4: Degenerative vertebral disc space narrowing with diffuse disc osteophyte complex. Broad posterior component flattens and effaces the ventral thecal sac. Mild cord flattening without cord signal changes. Moderate spinal stenosis. Moderate right with mild left C4 foraminal narrowing.   C4-C5: Degenerative intervertebral disc space narrowing with diffuse disc bulge and bilateral uncovertebral spurring. Flattening and partial effacement of the ventral thecal sac. Resultant mild-to-moderate spinal stenosis. Mild to moderate left with mild right C5 foraminal stenosis.   C5-C6: Disc bulge with bilateral uncovertebral spurring, greater on the left. Flattening of the ventral thecal sac with resultant mild spinal stenosis. Mild left C6 foraminal narrowing. Right neural foramina remains patent.   C6-C7: Degenerative intervertebral disc space narrowing with diffuse disc osteophyte complex. Flattening and partial effacement of the ventral thecal sac. Resultant moderate spinal stenosis. Moderate right worse than left C7 foraminal narrowing.   C7-T1: Diffuse disc bulge. Mild facet and ligament flavum hypertrophy. No significant spinal stenosis. Foramina remain patent.   IMPRESSION: 1. Multilevel  cervical spondylosis with resultant mild to moderate diffuse spinal stenosis at C2-3 through C6-7. 2. Multifactorial degenerative changes with resultant multilevel foraminal narrowing as above. Notable findings include moderate right C4 and bilateral C7 foraminal stenosis, with mild to moderate left C5 foraminal narrowing.     Electronically Signed   By: Rise Mu M.D.   On: 01/17/2023 05:26     Objective:  VS:  HT:    WT:   BMI:     BP:(!) 162/90  HR:(!) 51bpm  TEMP: ( )  RESP:  Physical Exam Vitals and nursing note reviewed.  Constitutional:      General: He is not in acute distress.    Appearance: Normal appearance. He is not ill-appearing.  HENT:     Head: Normocephalic and atraumatic.     Right Ear: External ear normal.     Left Ear: External ear normal.  Eyes:     Extraocular Movements: Extraocular movements intact.  Cardiovascular:     Rate and Rhythm: Normal rate.     Pulses: Normal pulses.  Abdominal:     General: There is no distension.     Palpations: Abdomen is soft.  Musculoskeletal:        General: No signs of injury.     Cervical back: Neck supple. Tenderness present. No rigidity.     Right lower leg: No edema.     Left lower leg: No edema.     Comments: Patient has good strength in the upper extremities with 5 out of 5 strength in wrist extension long finger flexion APB.  No intrinsic hand muscle atrophy.  Negative Hoffmann's test.  Lymphadenopathy:     Cervical: No cervical adenopathy.  Skin:    Findings: No erythema or rash.  Neurological:     General: No  focal deficit present.     Mental Status: He is alert and oriented to person, place, and time.     Sensory: No sensory deficit.     Motor: No weakness or abnormal muscle tone.     Coordination: Coordination normal.  Psychiatric:        Mood and Affect: Mood normal.        Behavior: Behavior normal.      Imaging: No results found.

## 2023-03-31 NOTE — Procedures (Signed)
 Cervical Epidural Steroid Injection - Interlaminar Approach with Fluoroscopic Guidance  Patient: Jonathan Lyons      Date of Birth: 11-04-1965 MRN: [id number] PCP: Claiborne Rigg, NP      Visit Date: 03/31/2023   Universal Protocol:    Date/Time: 02/27/258:45 AM  Consent Given By: the patient  Position: PRONE  Additional Comments: Vital signs were monitored before and after the procedure. Patient was prepped and draped in the usual sterile fashion. The correct patient, procedure, and site was verified.   Injection Procedure Details:   Procedure diagnoses: Cervical radiculopathy [M54.12]    Meds Administered:  Meds ordered this encounter  Medications   methylPREDNISolone acetate (DEPO-MEDROL) injection 40 mg     Laterality: Right  Location/Site: C7-T1  Needle: 3.5 in., 20 ga. Tuohy  Needle Placement: Paramedian epidural space  Findings:  -Comments: Excellent flow of contrast into the epidural space.  Procedure Details: Using a paramedian approach from the side mentioned above, the region overlying the inferior lamina was localized under fluoroscopic visualization and the soft tissues overlying this structure were infiltrated with 4 ml. of 1% Lidocaine without Epinephrine. A # 20 gauge, Tuohy needle was inserted into the epidural space using a paramedian approach.  The epidural space was localized using loss of resistance along with contralateral oblique bi-planar fluoroscopic views.  After negative aspirate for air, blood, and CSF, a 2 ml. volume of Isovue-250 was injected into the epidural space and the flow of contrast was observed. Radiographs were obtained for documentation purposes.   The injectate was administered into the level noted above.  Additional Comments:  No complications occurred Dressing: 2 x 2 sterile gauze and Band-Aid    Post-procedure details: Patient was observed during the procedure. Post-procedure instructions were reviewed.  Patient  left the clinic in stable condition.

## 2023-03-31 NOTE — Patient Instructions (Signed)

## 2023-03-31 NOTE — Progress Notes (Signed)
 Pain Score----7 No Blood Thinners No Allergies to contrast Dye

## 2023-04-02 DIAGNOSIS — Z419 Encounter for procedure for purposes other than remedying health state, unspecified: Secondary | ICD-10-CM | POA: Diagnosis not present

## 2023-04-04 ENCOUNTER — Ambulatory Visit: Payer: Medicaid Other | Admitting: Orthopedic Surgery

## 2023-04-12 DIAGNOSIS — H40013 Open angle with borderline findings, low risk, bilateral: Secondary | ICD-10-CM | POA: Diagnosis not present

## 2023-05-04 ENCOUNTER — Ambulatory Visit (HOSPITAL_COMMUNITY)
Admission: EM | Admit: 2023-05-04 | Discharge: 2023-05-04 | Disposition: A | Discharge status: Home / Self Care | Attending: Emergency Medicine | Admitting: Emergency Medicine

## 2023-05-04 ENCOUNTER — Encounter (HOSPITAL_COMMUNITY): Payer: Self-pay

## 2023-05-04 DIAGNOSIS — R519 Headache, unspecified: Secondary | ICD-10-CM

## 2023-05-04 DIAGNOSIS — M542 Cervicalgia: Secondary | ICD-10-CM

## 2023-05-04 MED ORDER — IBUPROFEN 600 MG PO TABS: 600.0000 mg | ORAL_TABLET | Freq: Four times a day (QID) | ORAL | 0 refills | Status: DC | PRN

## 2023-05-04 NOTE — ED Triage Notes (Signed)
 Pt states every night until morning he has a throbbing feeling to top of his head x1 month. States takes tylenol with no relief.

## 2023-05-04 NOTE — ED Provider Notes (Addendum)
 [facility]    CSN: [id number] Arrival date & time: 05/04/23  0802      History   Chief Complaint Chief Complaint  Patient presents with   Headache    HPI Jonathan Lyons is a 58 y.o. male.   Patient presents with intermittent headaches x 1 month.  Patient states that every night he will begin to have a headache around 10 PM which will last throughout the night until 7 AM.  Denies any headache at this time.  Denies blurred vision, nausea, vomiting, slurred speech, weakness, numbness, and confusion.  Patient states he has been taking one Tylenol tablet once daily without relief.  History of hypertension and states that he takes amlodipine daily.   Headache   Past Medical History:  Diagnosis Date   Essential hypertension 09/12/2017   Hypertension    Known health problems: none     Patient Active Problem List   Diagnosis Date Noted   Bilateral arm weakness 07/23/2021   Abnormal CT scan, cervical spine 02/14/2019   Neck pain 02/14/2019   Essential hypertension 09/12/2017    Past Surgical History:  Procedure Laterality Date   NO PAST SURGERIES         Home Medications    Prior to Admission medications   Medication Sig Start Date End Date Taking? Authorizing Provider  amLODipine (NORVASC) 10 MG tablet Take 1 tablet (10 mg total) by mouth daily. 12/09/22   Anders Simmonds, PA-C  cyclobenzaprine (FLEXERIL) 5 MG tablet Take 1 tablet (5 mg total) by mouth 3 (three) times daily as needed for muscle spasms. 12/09/22   Anders Simmonds, PA-C  diclofenac Sodium (VOLTAREN) 1 % GEL Apply 2 g topically 4 (four) times daily. To back of neck Patient not taking: Reported on 09/27/2022 05/24/22   Claiborne Rigg, NP  gabapentin (NEURONTIN) 300 MG capsule Take 1 capsule (300 mg total) by mouth 3 (three) times daily. 03/07/23   London Sheer, MD  ibuprofen (ADVIL) 600 MG tablet Take 1 tablet (600 mg total) by mouth every 6 (six) hours as needed for headache. For  headache 05/04/23   Wynonia Lawman A, NP  levothyroxine (SYNTHROID) 75 MCG tablet Take 1 tablet (75 mcg total) by mouth daily. 11/06/22   Claiborne Rigg, NP  valsartan (DIOVAN) 160 MG tablet Take 1 tablet (160 mg total) by mouth daily. For blood pressure 01/03/23   Claiborne Rigg, NP  famotidine (PEPCID) 20 MG tablet Take 20 mg by mouth daily as needed for heartburn or indigestion.  07/04/19  [provider]    Family History Family History  Problem Relation Age of Onset   Hypertension Neg Hx    Diabetes Neg Hx    Cancer Neg Hx    Colon cancer Neg Hx    Colon polyps Neg Hx    Esophageal cancer Neg Hx    Rectal cancer Neg Hx    Stomach cancer Neg Hx     Social History Social History   Tobacco Use   Smoking status: Never   Smokeless tobacco: Never  Vaping Use   Vaping status: Never Used  Substance Use Topics   Alcohol use: No   Drug use: No     Allergies   Patient has no known allergies.   Review of Systems Review of Systems  Neurological:  Positive for headaches.   Per HPI  Physical Exam Triage Vital Signs ED Triage Vitals [05/04/23 0815]  Encounter Vitals Group  BP (!) 156/86     Systolic BP Percentile      Diastolic BP Percentile      Pulse Rate 68     Resp 20     Temp 97.9 F (36.6 C)     Temp Source Oral     SpO2 98 %     Weight      Height      Head Circumference      Peak Flow      Pain Score 0     Pain Loc      Pain Education      Exclude from Growth Chart    No data found.  Updated Vital Signs BP (!) 156/86 (BP Location: Left Arm)   Pulse 68   Temp 97.9 F (36.6 C) (Oral)   Resp 20   SpO2 98%   Visual Acuity Right Eye Distance:   Left Eye Distance:   Bilateral Distance:    Right Eye Near:   Left Eye Near:    Bilateral Near:     Physical Exam Vitals and nursing note reviewed.  Constitutional:      General: He is awake. He is not in acute distress.    Appearance: Normal appearance. He is well-developed and  well-groomed. He is not ill-appearing.  HENT:     Head: Normocephalic.  Eyes:     Extraocular Movements: Extraocular movements intact.     Pupils: Pupils are equal, round, and reactive to light.  Cardiovascular:     Rate and Rhythm: Normal rate and regular rhythm.  Pulmonary:     Effort: Pulmonary effort is normal.     Breath sounds: Normal breath sounds.  Skin:    General: Skin is warm and dry.  Neurological:     General: No focal deficit present.     Mental Status: He is alert and oriented to person, place, and time. Mental status is at baseline.     GCS: GCS eye subscore is 4. GCS verbal subscore is 5. GCS motor subscore is 6.     Cranial Nerves: Cranial nerves 2-12 are intact.     Sensory: Sensation is intact.     Motor: Motor function is intact.     Coordination: Coordination is intact.     Gait: Gait is intact.  Psychiatric:        Behavior: Behavior is cooperative.      UC Treatments / Results  Labs (all labs ordered are listed, but only abnormal results are displayed) Labs Reviewed - No data to display  EKG   Radiology No results found.  Procedures Procedures (including critical care time)  Medications Ordered in UC Medications - No data to display  Initial Impression / Assessment and Plan / UC Course  I have reviewed the triage vital signs and the nursing notes.  Pertinent labs & imaging results that were available during my care of the patient were reviewed by me and considered in my medical decision making (see chart for details).     Denies headache at this time.  No significant findings upon exam.  GCS 15.  No neurodeficits noted.  EOMI and PERRLA.  Prescribe 600 mg ibuprofen and recommended alternating this with Tylenol as needed for headache.  Discussed importance of hydration and taking blood pressure medication.  Discussed follow-up, return, and strict ER precautions. Final Clinical Impressions(s) / UC Diagnoses   Final diagnoses:   Intermittent headache     Discharge Instructions  Alternate between 650 mg of Tylenol (do not exceed 4000 mg in a day) and 600 mg of ibuprofen (do not exceed 2400mg  in a day) every 4-6 hours as needed for headache.  Be sure you are staying hydrated throughout the day as this could be contributing to your headaches during the night.  Be sure that you are also taking your blood pressure medication as prescribed daily.  You may need to follow-up with your primary care provider regarding further management of this.  If you continue to have headaches despite medication and hydration follow-up with your primary care provider for further evaluation and management.  Return here as needed.  If you develop severe headache, excessive vomiting, blurred vision, slurred speech, numbness, weakness, or confusion please seek immediate medical treatment in the emergency department.    ED Prescriptions     Medication Sig Dispense Auth. Provider   ibuprofen (ADVIL) 600 MG tablet Take 1 tablet (600 mg total) by mouth every 6 (six) hours as needed for headache. For headache 30 tablet Wynonia Lawman A, NP      PDMP not reviewed this encounter.   Letta Kocher, NP 05/04/23 0850    Letta Kocher, NP 05/04/23 0851    Letta Kocher, NP 05/04/23 5409    Letta Kocher, NP 05/04/23 [phone]

## 2023-05-04 NOTE — Discharge Instructions (Addendum)
 Alternate between 650 mg of Tylenol (do not exceed 4000 mg in a day) and 600 mg of ibuprofen (do not exceed 2400mg  in a day) every 4-6 hours as needed for headache.  Be sure you are staying hydrated throughout the day as this could be contributing to your headaches during the night.  Be sure that you are also taking your blood pressure medication as prescribed daily.  You may need to follow-up with your primary care provider regarding further management of this.  If you continue to have headaches despite medication and hydration follow-up with your primary care provider for further evaluation and management.  Return here as needed.  If you develop severe headache, excessive vomiting, blurred vision, slurred speech, numbness, weakness, or confusion please seek immediate medical treatment in the emergency department.

## 2023-05-14 DIAGNOSIS — Z419 Encounter for procedure for purposes other than remedying health state, unspecified: Secondary | ICD-10-CM | POA: Diagnosis not present

## 2023-05-20 ENCOUNTER — Ambulatory Visit (HOSPITAL_COMMUNITY)
Admission: EM | Admit: 2023-05-20 | Discharge: 2023-05-20 | Disposition: A | Discharge status: Home / Self Care | Attending: Nurse Practitioner | Admitting: Nurse Practitioner

## 2023-05-20 ENCOUNTER — Encounter (HOSPITAL_COMMUNITY): Payer: Self-pay | Admitting: *Deleted

## 2023-05-20 ENCOUNTER — Ambulatory Visit (INDEPENDENT_AMBULATORY_CARE_PROVIDER_SITE_OTHER)

## 2023-05-20 DIAGNOSIS — M542 Cervicalgia: Secondary | ICD-10-CM

## 2023-05-20 DIAGNOSIS — M545 Low back pain, unspecified: Secondary | ICD-10-CM

## 2023-05-20 MED ORDER — METHYLPREDNISOLONE 4 MG PO TBPK: ORAL_TABLET | ORAL | 0 refills | Status: AC

## 2023-05-20 NOTE — ED Triage Notes (Signed)
 Pt states that he has neck pain that has now radiated to his whole back. 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 He has been using OTC roll on muscle relaxer without relief.

## 2023-05-20 NOTE — ED Provider Notes (Signed)
 [facility]    CSN: [id number] Arrival date & time: 05/20/23  4098      History   Chief Complaint Chief Complaint  Patient presents with   Back Pain    HPI Jonathan Lyons is a 58 y.o. male.   HPI  He is in today for evaluation of neck pain.  He endorses that he has a history of neck pain.  However he feels like the pain has gotten worse over the last 3 weeks.  He is now experiencing neck pain that is going down into his back.  He reports that he is physical at work.  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  He reports that the heavy lifting is what is causing him problems.  He endorses that he has muscle pain bilaterally but feels like this is just part of the job.  He does have a history of numbness and tingling in his upper extremities which comes and goes.  He denies any bilateral lower extremity numbness tingling or weakness.  He endorses that he is just not able to stand up straight due to the pain.  He denies any headache, dizziness, shortness of breath, chest pain or change in bowel or bladder. Past Medical History:  Diagnosis Date   Essential hypertension 09/12/2017   Hypertension    Known health problems: none     Patient Active Problem List   Diagnosis Date Noted   Bilateral arm weakness 07/23/2021   Abnormal CT scan, cervical spine 02/14/2019   Neck pain 02/14/2019   Essential hypertension 09/12/2017    Past Surgical History:  Procedure Laterality Date   NO PAST SURGERIES         Home Medications    Prior to Admission medications   Medication Sig Start Date End Date Taking? Authorizing Provider  amLODipine  (NORVASC ) 10 MG tablet Take 1 tablet (10 mg total) by mouth daily. 12/09/22  Yes Dulce Gibbs M, PA-C  cyclobenzaprine  (FLEXERIL ) 5 MG tablet Take 1 tablet (5 mg total) by mouth 3 (three) times daily as needed for muscle spasms. 12/09/22  Yes Dulce Gibbs M, PA-C   diclofenac  Sodium (VOLTAREN ) 1 % GEL Apply 2 g topically 4 (four) times daily. To back of neck 05/24/22  Yes Collins Dean, NP  gabapentin  (NEURONTIN ) 300 MG capsule Take 1 capsule (300 mg total) by mouth 3 (three) times daily. 03/07/23  Yes Diedra Fowler, MD  ibuprofen  (ADVIL ) 600 MG tablet Take 1 tablet (600 mg total) by mouth every 6 (six) hours as needed for headache. For headache 05/04/23  Yes Levora Reas A, NP  levothyroxine  (SYNTHROID ) 75 MCG tablet Take 1 tablet (75 mcg total) by mouth daily. 11/06/22  Yes Collins Dean, NP  methylPREDNISolone  (MEDROL ) 4 MG TBPK tablet Follow package instructions. 05/20/23 05/26/23 Yes Gregoria Leas, NP  valsartan  (DIOVAN ) 160 MG tablet Take 1 tablet (160 mg total) by mouth daily. For blood pressure 01/03/23  Yes Fleming, Zelda W, NP  famotidine (PEPCID) 20 MG tablet Take 20 mg by mouth daily as needed for heartburn or indigestion.  07/04/19  [provider]    Family History Family History  Problem Relation Age of Onset   Hypertension Neg Hx    Diabetes Neg Hx    Cancer Neg Hx    Colon cancer Neg Hx    Colon polyps Neg Hx    Esophageal cancer Neg  Hx    Rectal cancer Neg Hx    Stomach cancer Neg Hx     Social History Social History   Tobacco Use   Smoking status: Never   Smokeless tobacco: Never  Vaping Use   Vaping status: Never Used  Substance Use Topics   Alcohol use: No   Drug use: No     Allergies   Patient has no known allergies.   Review of Systems Review of Systems   Physical Exam Triage Vital Signs ED Triage Vitals  Encounter Vitals Group     BP 05/20/23 0921 (!) 152/87     Systolic BP Percentile --      Diastolic BP Percentile --      Pulse Rate 05/20/23 0921 (!) 55     Resp 05/20/23 0921 18     Temp 05/20/23 0921 98.2 F (36.8 C)     Temp Source 05/20/23 0921 Oral     SpO2 05/20/23 0921 98 %     Weight --      Height --      Head Circumference --      Peak Flow --      Pain Score 05/20/23  0920 8     Pain Loc --      Pain Education --      Exclude from Growth Chart --    No data found.  Updated Vital Signs BP (!) 152/87 (BP Location: Right Arm)   Pulse (!) 55   Temp 98.2 F (36.8 C) (Oral)   Resp 18   SpO2 98%   Visual Acuity Right Eye Distance:   Left Eye Distance:   Bilateral Distance:    Right Eye Near:   Left Eye Near:    Bilateral Near:     Physical Exam Constitutional:      Appearance: He is normal weight. He is not ill-appearing.  HENT:     Head: Normocephalic and atraumatic.  Cardiovascular:     Rate and Rhythm: Normal rate.  Pulmonary:     Effort: Pulmonary effort is normal.  Musculoskeletal:     Cervical back: Tenderness present.     Thoracic back: Normal.     Lumbar back: Tenderness present.     Comments: Figure 4 negative Questionable pain with bilateral leg raises 45 degrees left less than 45 degrees on the right   Neurological:     Mental Status: He is alert.      UC Treatments / Results  Labs (all labs ordered are listed, but only abnormal results are displayed) Labs Reviewed - No data to display  EKG   Radiology No results found.  Procedures Procedures (including critical care time)  Medications Ordered in UC Medications - No data to display  Initial Impression / Assessment and Plan / UC Course  I have reviewed the triage vital signs and the nursing notes.  Pertinent labs & imaging results that were available during my care of the patient were reviewed by me and considered in my medical decision making (see chart for details).     Neck and back pain for Final Clinical Impressions(s) / UC Diagnoses   Final diagnoses:  Neck pain  Midline low back pain without sciatica, unspecified chronicity     Discharge Instructions      Your lumbar spine x-ray does not show any acute abnormality however your official radiology overread is pending.  You have been prescribed a Medrol  Dosepak to help with the pain since you  are  already on ibuprofen  and cyclobenzaprine .  You are encouraged to follow-up with orthopedics since you are already a patient within their office.  If your symptoms persist or get worse you are always able to return to urgent care or go to your nearest emergency room for additional evaluation.     ED Prescriptions     Medication Sig Dispense Auth. Provider   methylPREDNISolone  (MEDROL ) 4 MG TBPK tablet Follow package instructions. 21 tablet Gregoria Leas, NP      PDMP not reviewed this encounter.   Eleanore Grey [place], NP 05/20/23 1031

## 2023-05-20 NOTE — Discharge Instructions (Addendum)
 Your lumbar spine x-ray does not show any acute abnormality however your official radiology overread is pending.  You have been prescribed a Medrol  Dosepak to help with the pain since you are already on ibuprofen  and cyclobenzaprine .  You are encouraged to follow-up with orthopedics since you are already a patient within their office.  If your symptoms persist or get worse you are always able to return to urgent care or go to your nearest emergency room for additional evaluation.

## 2023-06-13 DIAGNOSIS — Z419 Encounter for procedure for purposes other than remedying health state, unspecified: Secondary | ICD-10-CM | POA: Diagnosis not present

## 2023-06-15 ENCOUNTER — Ambulatory Visit: Payer: Self-pay

## 2023-06-15 NOTE — Telephone Encounter (Signed)
 Noted.

## 2023-06-15 NOTE — Telephone Encounter (Signed)
 Copied from CRM [id number]. Topic: Clinical - Red Word Triage >> Jun 15, 2023  2:23 PM Hassie Lint wrote: Red Word that prompted transfer to Nurse Triage: Patient went to to ER on 04/18 for Back pain. States has taken all the medication the prescribed and he is still in pain. Also is having headaches with the back pain.  Chief Complaint: back pain Symptoms: pain in back and headache Frequency: constant Pertinent Negatives: Patient denies fever, numbness, tingling, weakness, bowel/bladder issues Disposition: [] ED /[] Urgent Care (no appt availability in office) / [x] Appointment(In office/virtual)/ []  [facility]/ [] Home Care/ [] Refused Recommended Disposition /[] [facility]/ []  Follow-up with PCP Additional Notes: per protocol apt made for tomorrow; care advice given, denies questions; instructed to go to ER if becomes worse.   Reason for Disposition  [1] MODERATE back pain (e.g., interferes with normal activities) AND [2] present > 3 days  Answer Assessment - Initial Assessment Questions 1. ONSET: "When did the pain begin?"      Back pain, last month was seen in ER on 05/20/23 2. LOCATION: "Where does it hurt?" (upper, mid or lower back)     Whole back 3. SEVERITY: "How bad is the pain?"  (e.g., Scale 1-10; mild, moderate, or severe)   - MILD (1-3): Doesn't interfere with normal activities.    - MODERATE (4-7): Interferes with normal activities or awakens from sleep.    - SEVERE (8-10): Excruciating pain, unable to do any normal activities.      Moderate  4. PATTERN: "Is the pain constant?" (e.g., yes, no; constant, intermittent)      constant 5. RADIATION: "Does the pain shoot into your legs or somewhere else?"     denies 6. CAUSE:  "What do you think is causing the back pain?"      unknown 7. BACK OVERUSE:  "Any recent lifting of heavy objects, strenuous work or exercise?"     Yes, lifting heavy daily at work 8. MEDICINES: "What have you taken so far for the pain?"  (e.g., nothing, acetaminophen , NSAIDS)     Muscle relaxer 9. NEUROLOGIC SYMPTOMS: "Do you have any weakness, numbness, or problems with bowel/bladder control?"     no 10. OTHER SYMPTOMS: "Do you have any other symptoms?" (e.g., fever, abdomen pain, burning with urination, blood in urine)       headache 11. PREGNANCY: "Is there any chance you are pregnant?" "When was your last menstrual period?"       na  Protocols used: Back Pain-A-AH

## 2023-06-16 ENCOUNTER — Ambulatory Visit: Payer: Self-pay | Attending: Internal Medicine | Admitting: Internal Medicine

## 2023-06-16 ENCOUNTER — Encounter: Payer: Self-pay | Admitting: Internal Medicine

## 2023-06-16 VITALS — BP 148/78 | HR 53 | Temp 97.7°F | Ht 70.0 in

## 2023-06-16 DIAGNOSIS — M545 Low back pain, unspecified: Secondary | ICD-10-CM | POA: Diagnosis not present

## 2023-06-16 DIAGNOSIS — I10 Essential (primary) hypertension: Secondary | ICD-10-CM | POA: Diagnosis not present

## 2023-06-16 DIAGNOSIS — M47812 Spondylosis without myelopathy or radiculopathy, cervical region: Secondary | ICD-10-CM

## 2023-06-16 DIAGNOSIS — R519 Headache, unspecified: Secondary | ICD-10-CM

## 2023-06-16 MED ORDER — CYCLOBENZAPRINE HCL 5 MG PO TABS: 5.0000 mg | ORAL_TABLET | Freq: Every day | ORAL | 0 refills | Status: DC | PRN

## 2023-06-16 MED ORDER — AMLODIPINE BESYLATE 10 MG PO TABS: 10.0000 mg | ORAL_TABLET | Freq: Every day | ORAL | 1 refills | Status: DC

## 2023-06-16 MED ORDER — MELOXICAM 15 MG PO TABS: 15.0000 mg | ORAL_TABLET | Freq: Every day | ORAL | 0 refills | Status: DC

## 2023-06-16 NOTE — Patient Instructions (Signed)
 VISIT SUMMARY:  Today, you were seen for worsening neck and back pain, which you have been experiencing for the past three weeks, and for your hypertension management.  YOUR PLAN:  -NECK PAIN WITH DEGENERATIVE CHANGES: You have chronic neck pain due to wear and tear in your cervical spine, which has been made worse by your work activities. We have prescribed cyclobenzaprine  to help relax your muscles, but be cautious as it can make you drowsy. Use a heating pad or warm towel on your neck and avoid heavy lifting, pushing, and pulling for the next 2-3 weeks.  -BACK PAIN WITH MUSCLE STRAIN: Your back pain is likely due to muscle strain from heavy lifting at work. We recommend using a heating pad or warm towel on your back. We have also prescribed meloxicam  to replace ibuprofen , to be taken once daily. Avoid heavy lifting, pushing, and pulling for the next 2-3 weeks.  -HYPERTENSION: Your blood pressure is currently elevated, partly due to inconsistent intake of your medication. We have refilled your amlodipine  prescription and you should take both amlodipine  and valsartan  every morning. It is important to take your medications consistently to keep your blood pressure under control.  INSTRUCTIONS:  Please follow up in 4 weeks to reassess your neck and back pain and to check your blood pressure. If your symptoms worsen or you experience any new symptoms, contact our office immediately.

## 2023-06-16 NOTE — Progress Notes (Signed)
 Patient ID: Jonathan Lyons, male    DOB: 01/27/1966  MRN: [id number]  CC: Neck Pain (Neck pain radiating to lower back X3 weeks /Headache starting at 9 p.m. to A.M. - hears throbbing sounds)   Subjective: Jonathan Lyons is a 58 y.o. male who presents for UC visit.  PCP is [facility]. His concerns today include:  Patient with history of HTN, hypothyroid, chronic neck pain  Discussed the use of AI scribe software for clinical note transcription with the patient, who gave verbal consent to proceed.  History of Present Illness Jonathan Lyons is a 58 year old male with a history of chronic neck pain and hypertension who presents with worsening neck and new onset lower back pain.  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 The pain in the lower back that extends from mid thoracic to the lumbar region worsens with lifting; the job involves lifting 40 to 50 pounds repeatedly.  No radiation of pain down the legs.  Pain in the neck is worse with movement radiating from the neck to the lower back.  Endorses some numbness in the hands at times.  He was seeing orthopedic specialist Colette Davies for chronic neck pain.  Had an MRI of the neck done in December that showed multilevel cervical spondylosis with some spinal stenosis.  Had ESI to the neck done back in February which had helped.  He experiences headaches at the back of the head, sometimes with elevated blood pressure. He takes valsartan  160 mg daily for hypertension but has been inconsistent with amlodipine  10 mg due to a low supply.  Seen at urgent care on 05/20/2023 for neck and back pain.  Given a Medrol  pack and ibuprofen  which he found helpful.  He had requested a refill but was unable to get it.  X-ray of the lumbar spine done on that visit was negative.    Patient Active Problem List   Diagnosis Date Noted    Bilateral arm weakness 07/23/2021   Abnormal CT scan, cervical spine 02/14/2019   Neck pain 02/14/2019   Essential hypertension 09/12/2017     Current Outpatient Medications on File Prior to Visit  Medication Sig Dispense Refill   diclofenac  Sodium (VOLTAREN ) 1 % GEL Apply 2 g topically 4 (four) times daily. To back of neck 100 g 3   gabapentin  (NEURONTIN ) 300 MG capsule Take 1 capsule (300 mg total) by mouth 3 (three) times daily. 90 capsule 0   levothyroxine  (SYNTHROID ) 75 MCG tablet Take 1 tablet (75 mcg total) by mouth daily. 90 tablet 1   valsartan  (DIOVAN ) 160 MG tablet Take 1 tablet (160 mg total) by mouth daily. For blood pressure 90 tablet 1   [DISCONTINUED] famotidine (PEPCID) 20 MG tablet Take 20 mg by mouth daily as needed for heartburn or indigestion.     No current facility-administered medications on file prior to visit.    No Known Allergies  Social History   Socioeconomic History   Marital status: Married    Spouse name: Not on file   Number of children: 5   Years of education: Not on file   Highest education level: Not on file  Occupational History   Occupation: [employer]  Tobacco Use   Smoking status: Never   Smokeless tobacco: Never  Vaping Use   Vaping status: Never Used  Substance and Sexual Activity  Alcohol use: No   Drug use: No   Sexual activity: Not Currently  Other Topics Concern   Not on file  Social History Narrative   ** Merged History Encounter **       Separated - 5 grown children 1 daughter 4 sons Company secretary  No EtOH, tobacco or drugs Emigrated from [place] decades ahgo   Social Drivers of Corporate investment banker Strain: Not on [employer] Insecurity: Not on file  Transportation Needs: Not on file  Physical Activity: Not on file  Stress: Not on file  Social Connections: Not on file  Intimate Partner Violence: Not on file    Family History  Problem Relation Age of Onset   Hypertension Neg Hx    Diabetes Neg Hx     Cancer Neg Hx    Colon cancer Neg Hx    Colon polyps Neg Hx    Esophageal cancer Neg Hx    Rectal cancer Neg Hx    Stomach cancer Neg Hx     Past Surgical History:  Procedure Laterality Date   NO PAST SURGERIES      ROS: Review of Systems Negative except as stated above  PHYSICAL EXAM: BP (!) 148/78 (BP Location: Left Arm, Patient Position: Sitting, Cuff Size: Normal)   Pulse (!) 53   Temp 97.7 F (36.5 C) (Oral)   Ht 5\' 10"  (1.778 m)   SpO2 99%   BMI 26.69 kg/m   Physical Exam   General appearance - alert, well appearing, middle-age to older African male and in no distress Mental status - normal mood, behavior, speech, dress, motor activity, and thought processes Neurological/MSK-gait is stable.  Mild tenderness on palpation of the lower thoracic and lumbar spine and surrounding paraspinal muscles.    Latest Ref Rng & Units 05/24/2022    4:04 PM 09/24/2021   11:10 AM 07/22/2021    5:56 PM  CMP  Glucose 70 - 99 mg/dL 84  92  89   BUN 6 - 24 mg/dL 12  19  11    Creatinine 0.76 - 1.27 mg/dL 2.95  6.21  3.08   Sodium 134 - 144 mmol/L 141  141  141   Potassium 3.5 - 5.2 mmol/L 4.4  3.6  4.1   Chloride 96 - 106 mmol/L 107  99  105   CO2 20 - 29 mmol/L 22  24  23    Calcium 8.7 - 10.2 mg/dL 9.0  9.9  9.3   Total Protein 6.0 - 8.5 g/dL 6.7  7.2    Total Bilirubin 0.0 - 1.2 mg/dL 0.2  0.3    Alkaline Phos 44 - 121 IU/L 72  70    AST 0 - 40 IU/L 17  19    ALT 0 - 44 IU/L 16  18     Lipid Panel     Component Value Date/Time   CHOL 142 09/10/2020 1034   TRIG 39 09/10/2020 1034   HDL 60 09/10/2020 1034   CHOLHDL 2.4 09/10/2020 1034   LDLCALC 73 09/10/2020 1034    CBC    Component Value Date/Time   WBC 5.5 09/24/2021 1110   WBC 5.3 07/25/2018 2020   RBC 4.82 09/24/2021 1110   RBC 4.98 07/25/2018 2020   HGB 13.7 09/24/2021 1110   HCT 41.2 09/24/2021 1110   PLT 232 09/24/2021 1110   MCV 86 09/24/2021 1110   MCH 28.4 09/24/2021 1110   MCH 28.3 07/25/2018 2020    MCHC  33.3 09/24/2021 1110   MCHC 33.9 07/25/2018 2020   RDW 13.0 09/24/2021 1110   LYMPHSABS 1.8 09/24/2021 1110   MONOABS 0.4 05/02/2018 0923   EOSABS 0.1 09/24/2021 1110   BASOSABS 0.1 09/24/2021 1110    ASSESSMENT AND PLAN: Assessment & Plan Neck pain with spondylosis and spinal stenosis on MRI done about 6 months ago. exacerbated by work-related activities. Recent steroid injection provided temporary relief. - Prescribed cyclobenzaprine  for muscle relaxation, cautioned about drowsiness and advised against driving or operating heavy machinery.  Stop ibuprofen , changed to meloxicam  15 mg daily. - Recommended use of a heating pad or warm towel on the neck. - Advised avoiding heavy lifting, excessive pushing, and pulling for 2-3 weeks. - He declines request for lifting restrictions at work stating that he has left the job.  Back pain with muscle strain Acute back pain likely due to muscle strain from heavy lifting at work, localized to mid thoracic and lumbar spine. - Recommended use of a heating pad or warm towel on the back. - Prescribed meloxicam  to replace ibuprofen , to be taken once daily.  Limited supply of Flexeril  given.  Warned that the medicine can cause drowsiness. - Advised avoiding heavy lifting, excessive pushing, and pulling for 2-3 weeks.  Hypertension Hypertension with current elevated blood pressure, inconsistent amlodipine  intake due to low supply. - Refilled amlodipine  prescription and advised taking both amlodipine  and valsartan  every morning. - Educated on the importance of consistent medication adherence to control blood pressure.  Occipital headaches - May be related to chronic neck issue plus or minus uncontrolled blood pressure.  See recommendations above.      Patient was given the opportunity to ask questions.  Patient verbalized understanding of the plan and was able to repeat key elements of the plan.   This documentation was completed using  Paediatric nurse.  Any transcriptional errors are unintentional.  No orders of the defined types were placed in this encounter.    Requested Prescriptions   Signed Prescriptions Disp Refills   amLODipine  (NORVASC ) 10 MG tablet 90 tablet 1    Sig: Take 1 tablet (10 mg total) by mouth daily.   cyclobenzaprine  (FLEXERIL ) 5 MG tablet 20 tablet 0    Sig: Take 1 tablet (5 mg total) by mouth daily as needed for muscle spasms.   meloxicam  (MOBIC ) 15 MG tablet 30 tablet 0    Sig: Take 1 tablet (15 mg total) by mouth daily.    Return in about 6 weeks (around 07/28/2023) for Give f/u with PCP for chronic ds management.  Concetta Dee, MD, FACP

## 2023-07-14 DIAGNOSIS — Z419 Encounter for procedure for purposes other than remedying health state, unspecified: Secondary | ICD-10-CM | POA: Diagnosis not present

## 2023-07-18 ENCOUNTER — Emergency Department (HOSPITAL_COMMUNITY)
Admission: EM | Admit: 2023-07-18 | Discharge: 2023-07-18 | Disposition: A | Discharge status: Home / Self Care | Attending: Emergency Medicine | Admitting: Emergency Medicine

## 2023-07-18 ENCOUNTER — Encounter (HOSPITAL_COMMUNITY): Payer: Self-pay

## 2023-07-18 ENCOUNTER — Emergency Department (HOSPITAL_COMMUNITY)

## 2023-07-18 ENCOUNTER — Other Ambulatory Visit: Payer: Self-pay

## 2023-07-18 DIAGNOSIS — I10 Essential (primary) hypertension: Secondary | ICD-10-CM | POA: Insufficient documentation

## 2023-07-18 DIAGNOSIS — Z79899 Other long term (current) drug therapy: Secondary | ICD-10-CM | POA: Diagnosis not present

## 2023-07-18 DIAGNOSIS — R202 Paresthesia of skin: Secondary | ICD-10-CM | POA: Diagnosis not present

## 2023-07-18 DIAGNOSIS — R519 Headache, unspecified: Secondary | ICD-10-CM | POA: Insufficient documentation

## 2023-07-18 DIAGNOSIS — R200 Anesthesia of skin: Secondary | ICD-10-CM | POA: Diagnosis not present

## 2023-07-18 DIAGNOSIS — M545 Low back pain, unspecified: Secondary | ICD-10-CM

## 2023-07-18 DIAGNOSIS — M47812 Spondylosis without myelopathy or radiculopathy, cervical region: Secondary | ICD-10-CM

## 2023-07-18 MED ORDER — CYCLOBENZAPRINE HCL 5 MG PO TABS: 5.0000 mg | ORAL_TABLET | Freq: Every day | ORAL | 0 refills | Status: DC | PRN

## 2023-07-18 NOTE — ED Provider Notes (Signed)
 [place] EMERGENCY DEPARTMENT AT [facility] Provider Note   CSN: [id number] Arrival date & time: 07/18/23  4098     Patient presents with: Headache   Jonathan Lyons is a 58 y.o. male.    Headache    Patient has history of hypertension.  Patient also reports having chronic neck trouble associated with a prior injury.  Patient states recently has been having trouble with headache for the last couple months.  Patient states it is in both of his temples.  It tends to come and go.  He denies any fevers or chills.  No vomiting.  No photophobia.  He went to an urgent care back in April.  Patient states he was given prescriptions but he continues to have this headache.  Patient states he saw his primary doctor in May as well.  He came to the ED today because he continues to have these headaches and is concerned.  Prior to Admission medications   Medication Sig Start Date End Date Taking? Authorizing Provider  amLODipine  (NORVASC ) 10 MG tablet Take 1 tablet (10 mg total) by mouth daily. 06/16/23   Lawrance Presume, MD  cyclobenzaprine  (FLEXERIL ) 5 MG tablet Take 1 tablet (5 mg total) by mouth daily as needed for muscle spasms. 07/18/23   Trish Furl, MD  diclofenac  Sodium (VOLTAREN ) 1 % GEL Apply 2 g topically 4 (four) times daily. To back of neck 05/24/22   Collins Dean, NP  gabapentin  (NEURONTIN ) 300 MG capsule Take 1 capsule (300 mg total) by mouth 3 (three) times daily. 03/07/23   Diedra Fowler, MD  levothyroxine  (SYNTHROID ) 75 MCG tablet Take 1 tablet (75 mcg total) by mouth daily. 11/06/22   Fleming, Zelda W, NP  meloxicam  (MOBIC ) 15 MG tablet Take 1 tablet (15 mg total) by mouth daily. 06/16/23   Lawrance Presume, MD  valsartan  (DIOVAN ) 160 MG tablet Take 1 tablet (160 mg total) by mouth daily. For blood pressure 01/03/23   Fleming, Zelda W, NP  famotidine (PEPCID) 20 MG tablet Take 20 mg by mouth daily as needed for heartburn or indigestion.  07/04/19  [provider]    Allergies: Patient has no known allergies.    Review of Systems  Neurological:  Positive for headaches.    Updated Vital Signs BP 118/85   Pulse (!) 48   Temp 98 F (36.7 C) (Oral)   Resp 18   Ht 1.778 m (5' 10)   Wt 85 kg   SpO2 100%   BMI 26.89 kg/m   Physical Exam Vitals and nursing note reviewed.  Constitutional:      General: He is not in acute distress.    Appearance: He is well-developed. He is not diaphoretic.  HENT:     Head: Normocephalic and atraumatic.     Right Ear: External ear normal.     Left Ear: External ear normal.   Eyes:     General: No visual field deficit or scleral icterus.       Right eye: No discharge.        Left eye: No discharge.     Conjunctiva/sclera: Conjunctivae normal.   Neck:     Trachea: No tracheal deviation.   Cardiovascular:     Rate and Rhythm: Normal rate and regular rhythm.  Pulmonary:     Effort: Pulmonary effort is normal. No respiratory distress.     Breath sounds: Normal breath sounds. No stridor. No wheezing or rales.  Abdominal:  General: Bowel sounds are normal. There is no distension.     Palpations: Abdomen is soft.     Tenderness: There is no abdominal tenderness. There is no guarding or rebound.   Musculoskeletal:        General: No tenderness or deformity.     Cervical back: Normal range of motion and neck supple. No rigidity. Normal range of motion.   Skin:    General: Skin is warm and dry.     Findings: No rash.   Neurological:     General: No focal deficit present.     Mental Status: He is alert and oriented to person, place, and time.     Cranial Nerves: No cranial nerve deficit, dysarthria or facial asymmetry.     Sensory: No sensory deficit.     Motor: No abnormal muscle tone, seizure activity or pronator drift.     Coordination: Coordination normal.     Comments:  able to hold both legs off bed for 5 seconds, sensation intact in all extremities,  no left or right sided  neglect, normal finger-nose exam bilaterally, no nystagmus noted   Psychiatric:        Mood and Affect: Mood normal.     (all labs ordered are listed, but only abnormal results are displayed) Labs Reviewed - No data to display  EKG: None  Radiology: CT Head Wo Contrast Result Date: 07/18/2023 CLINICAL DATA:  58 year old male with headache and neck pain. Bilateral hand numbness and tingling. EXAM: CT HEAD WITHOUT CONTRAST TECHNIQUE: Contiguous axial images were obtained from the base of the skull through the vertex without intravenous contrast. RADIATION DOSE REDUCTION: This exam was performed according to the departmental dose-optimization program which includes automated exposure control, adjustment of the mA and/or kV according to patient size and/or use of iterative reconstruction technique. COMPARISON:  None Available. FINDINGS: Brain: Normal cerebral volume. Asymmetry of the lateral ventricles appears to be normal variation. No midline shift, ventriculomegaly, mass effect, evidence of mass lesion, intracranial hemorrhage or evidence of cortically based acute infarction. Gray-white matter differentiation is within normal limits throughout the brain. Vascular: No suspicious intracranial vascular hyperdensity. Skull: Intact, negative. Sinuses/Orbits: Tympanic cavities, visualized paranasal sinuses and mastoids are well aerated. Other: Visualized orbits and scalp soft tissues are within normal limits. IMPRESSION: Normal noncontrast Head CT. Electronically Signed   By: Marlise Simpers M.D.   On: 07/18/2023 09:15     Procedures   Medications Ordered in the ED - No data to display  Clinical Course as of 07/18/23 0946  Mon Jul 18, 2023  0920 Head CT without acute abnormalities. [JK]    Clinical Course User Index [JK] Trish Furl, MD                                 Medical Decision Making Problems Addressed: Nonintractable headache, unspecified chronicity pattern, unspecified headache type:  acute illness or injury that poses a threat to life or bodily functions  Amount and/or Complexity of Data Reviewed Radiology: ordered and independent interpretation performed.  Risk Prescription drug management.   Patient presented ED for evaluation of chronic headaches.  Patient does not have any symptoms to suggest an infectious etiology such as meningitis or sinusitis.  Patient does not have any focal neurologic deficits to suggest stroke.  CT scan was performed there is no acute abnormality.  Symptoms could be related to a tension headache related to his neck issues.  Will have him try muscle relaxant.  Will refer to neurology and recommend outpatient follow-up with PCP.     Final diagnoses:  Nonintractable headache, unspecified chronicity pattern, unspecified headache type    ED Discharge Orders          Ordered    cyclobenzaprine  (FLEXERIL ) 5 MG tablet  Daily PRN        07/18/23 0937    Ambulatory referral to Neurology       Comments: An appointment is requested in approximately: 4 weeks   07/18/23 0937               Trish Furl, MD 07/18/23 [phone]

## 2023-07-18 NOTE — Discharge Instructions (Addendum)
 You can continue to take over-the-counter medications as needed.  Continue the muscle relaxant medications to see if that helps.  Follow-up with your primary care doctor and consider seeing the neurologist for further evaluation of your persistent headaches.

## 2023-07-18 NOTE — ED Triage Notes (Signed)
 Patient has had a headache and neck pain for 2 months. No changes in vision. No dizziness. Feels numbness and tingling in both of his hands for a long time.

## 2023-08-01 ENCOUNTER — Ambulatory Visit: Admitting: Nurse Practitioner

## 2023-08-01 ENCOUNTER — Encounter: Payer: Self-pay | Admitting: Nurse Practitioner

## 2023-08-01 ENCOUNTER — Ambulatory Visit: Attending: Nurse Practitioner | Admitting: Nurse Practitioner

## 2023-08-01 VITALS — BP 122/64 | HR 62 | Resp 19 | Ht 70.0 in | Wt 185.2 lb

## 2023-08-01 DIAGNOSIS — M5412 Radiculopathy, cervical region: Secondary | ICD-10-CM | POA: Diagnosis not present

## 2023-08-01 DIAGNOSIS — I10 Essential (primary) hypertension: Secondary | ICD-10-CM | POA: Diagnosis not present

## 2023-08-01 DIAGNOSIS — R7989 Other specified abnormal findings of blood chemistry: Secondary | ICD-10-CM | POA: Diagnosis not present

## 2023-08-01 DIAGNOSIS — E7800 Pure hypercholesterolemia, unspecified: Secondary | ICD-10-CM

## 2023-08-01 DIAGNOSIS — D649 Anemia, unspecified: Secondary | ICD-10-CM | POA: Diagnosis not present

## 2023-08-01 MED ORDER — DICLOFENAC SODIUM 1 % EX GEL: 2.0000 g | Freq: Four times a day (QID) | CUTANEOUS | 3 refills | Status: AC

## 2023-08-01 MED ORDER — VALSARTAN 160 MG PO TABS: 160.0000 mg | ORAL_TABLET | Freq: Every day | ORAL | 1 refills | Status: DC

## 2023-08-01 MED ORDER — CYCLOBENZAPRINE HCL 5 MG PO TABS: 5.0000 mg | ORAL_TABLET | Freq: Two times a day (BID) | ORAL | 1 refills | Status: DC | PRN

## 2023-08-01 MED ORDER — LEVOTHYROXINE SODIUM 75 MCG PO TABS: 75.0000 ug | ORAL_TABLET | Freq: Every day | ORAL | 1 refills | Status: DC

## 2023-08-01 NOTE — Patient Instructions (Addendum)
 COLONOSCOPY Jonathan Lyons [address] [place], [place] [address] PH# [phone]      NECK PAIN Placed in Jonathan Lyons Ph# [phone] [address] [name], [place] [address]

## 2023-08-01 NOTE — Progress Notes (Signed)
 Jonathan Lyons was seen today for hypertension.  Diagnoses and all orders for this visit:  Primary hypertension -     CMP14+EGFR -     valsartan  (DIOVAN ) 160 MG tablet; Take 1 tablet (160 mg total) by mouth daily. For blood pressure Continue all antihypertensives as prescribed.  Reminded to bring in blood pressure log for follow  up appointment.  RECOMMENDATIONS: DASH/Mediterranean Diets are healthier choices for HTN.    Cervical radiculopathy He was given the number for Dr Jeraline office (ortho) to schedule an appt -     diclofenac  Sodium (VOLTAREN ) 1 % GEL; Apply 2 g topically 4 (four) times daily. To back of neck -     cyclobenzaprine  (FLEXERIL ) 5 MG tablet; Take 1 tablet (5 mg total) by mouth 2 (two) times daily as needed for muscle spasms.  Abnormal TSH -     levothyroxine  (SYNTHROID ) 75 MCG tablet; Take 1 tablet (75 mcg total) by mouth daily. -     Thyroid  Panel With TSH  Anemia, unspecified type -     CBC with Differential  Hypercholesterolemia -     Lipid panel     Subjective:   Chief Complaint  Patient presents with   Hypertension    Jonathan Lyons 58 y.o. male presents to office today for HTN and with complaints of posterior cervical neck pain with radiculopathy to both arms and hands.   He has a past medical history of HTN (09/12/2017), cervical radiculopathy  Jonathan Lyons has been experiencing neck pain with numbness in hands and fingers. He had cervical injection a few months ago which he states was ineffective in relieving his pain. Unfortunately after his injection he did not follow up with ortho.  He states flexeril  and voltaren  provide some relief. Ineffective medications include meloxicam  and gabapentin . He also endorses dizziness which occurs with sudden movements.   HTN Blood pressure is well controlled with amlodipine  10 mg daily and valsartan  160 mg daily.  BP Readings from Last 3 Encounters:  08/01/23 122/64  07/18/23 118/85  06/16/23 (!) 148/78      Review of Systems  Constitutional:  Negative for fever, malaise/fatigue and weight loss.  HENT: Negative.  Negative for nosebleeds.   Eyes: Negative.  Negative for blurred vision, double vision and photophobia.  Respiratory: Negative.  Negative for cough and shortness of breath.   Cardiovascular: Negative.  Negative for chest pain, palpitations and leg swelling.  Gastrointestinal: Negative.  Negative for heartburn, nausea and vomiting.  Musculoskeletal:  Positive for neck pain. Negative for myalgias.  Neurological:  Positive for tingling and sensory change. Negative for dizziness, focal weakness, seizures and headaches.  Psychiatric/Behavioral: Negative.  Negative for suicidal ideas.     Past Medical History:  Diagnosis Date   Hypertension     Past Surgical History:  Procedure Laterality Date   NO PAST SURGERIES      Family History  Problem Relation Age of Onset   Hypertension Neg Hx    Diabetes Neg Hx    Cancer Neg Hx    Colon cancer Neg Hx    Colon polyps Neg Hx    Esophageal cancer Neg Hx    Rectal cancer Neg Hx    Stomach cancer Neg Hx     Social History Reviewed with no changes to be made today.   Outpatient Medications Prior to Visit  Medication Sig Dispense Refill   amLODipine  (NORVASC ) 10 MG tablet Take 1 tablet (10 mg total) by mouth daily. 90 tablet 1  gabapentin  (NEURONTIN ) 300 MG capsule Take 1 capsule (300 mg total) by mouth 3 (three) times daily. 90 capsule 0   cyclobenzaprine  (FLEXERIL ) 5 MG tablet Take 1 tablet (5 mg total) by mouth daily as needed for muscle spasms. 20 tablet 0   diclofenac  Sodium (VOLTAREN ) 1 % GEL Apply 2 g topically 4 (four) times daily. To back of neck 100 g 3   levothyroxine  (SYNTHROID ) 75 MCG tablet Take 1 tablet (75 mcg total) by mouth daily. 90 tablet 1   meloxicam  (MOBIC ) 15 MG tablet Take 1 tablet (15 mg total) by mouth daily. 30 tablet 0   valsartan  (DIOVAN ) 160 MG tablet Take 1 tablet (160 mg total) by mouth daily. For  blood pressure 90 tablet 1   No facility-administered medications prior to visit.    No Known Allergies     Objective:    BP 122/64 (BP Location: Left Arm, Patient Position: Sitting, Cuff Size: Normal)   Pulse 62   Resp 19   Ht 5' 10 (1.778 m)   Wt 185 lb 3.2 oz (84 kg)   SpO2 100%   BMI 26.57 kg/m  Wt Readings from Last 3 Encounters:  08/01/23 185 lb 3.2 oz (84 kg)  07/18/23 187 lb 6.3 oz (85 kg)  02/04/23 186 lb (84.4 kg)    Physical Exam Vitals and nursing note reviewed.  Constitutional:      Appearance: He is well-developed.  HENT:     Head: Normocephalic and atraumatic.   Cardiovascular:     Rate and Rhythm: Normal rate and regular rhythm.     Heart sounds: Normal heart sounds. No murmur heard.    No friction rub. No gallop.  Pulmonary:     Effort: Pulmonary effort is normal. No tachypnea or respiratory distress.     Breath sounds: Normal breath sounds. No decreased breath sounds, wheezing, rhonchi or rales.  Chest:     Chest wall: No tenderness.  Abdominal:     General: Bowel sounds are normal.     Palpations: Abdomen is soft.   Musculoskeletal:        General: Normal range of motion.     Cervical back: Normal range of motion.   Skin:    General: Skin is warm and dry.   Neurological:     Mental Status: He is alert and oriented to person, place, and time.     Coordination: Coordination normal.   Psychiatric:        Behavior: Behavior normal. Behavior is cooperative.        Thought Content: Thought content normal.        Judgment: Judgment normal.          Patient has been counseled extensively about nutrition and exercise as well as the importance of adherence with medications and regular follow-up. The patient was given clear instructions to go to ER or return to medical center if symptoms don't improve, worsen or new problems develop. The patient verbalized understanding.   Follow-up: Return in about 6 months (around 01/31/2024).   Haze LELON Servant, FNP-BC [facility], [place] [phone]   08/01/2023, 3:00 PM

## 2023-08-02 ENCOUNTER — Ambulatory Visit: Payer: Self-pay | Admitting: Nurse Practitioner

## 2023-08-02 DIAGNOSIS — E039 Hypothyroidism, unspecified: Secondary | ICD-10-CM

## 2023-08-02 LAB — CBC WITH DIFFERENTIAL/PLATELET
Basophils Absolute: 0.1 10*3/uL (ref 0.0–0.2)
Basos: 1 %
EOS (ABSOLUTE): 0.1 10*3/uL (ref 0.0–0.4)
Eos: 2 %
Hematocrit: 41.5 % (ref 37.5–51.0)
Hemoglobin: 13.7 g/dL (ref 13.0–17.7)
Immature Grans (Abs): 0 10*3/uL (ref 0.0–0.1)
Immature Granulocytes: 0 %
Lymphocytes Absolute: 1.8 10*3/uL (ref 0.7–3.1)
Lymphs: 36 %
MCH: 28.7 pg (ref 26.6–33.0)
MCHC: 33 g/dL (ref 31.5–35.7)
MCV: 87 fL (ref 79–97)
Monocytes Absolute: 0.6 10*3/uL (ref 0.1–0.9)
Monocytes: 11 %
Neutrophils Absolute: 2.4 10*3/uL (ref 1.4–7.0)
Neutrophils: 50 %
Platelets: 205 10*3/uL (ref 150–450)
RBC: 4.77 x10E6/uL (ref 4.14–5.80)
RDW: 12.9 % (ref 11.6–15.4)
WBC: 4.9 10*3/uL (ref 3.4–10.8)

## 2023-08-02 LAB — CMP14+EGFR
ALT: 14 IU/L (ref 0–44)
AST: 22 IU/L (ref 0–40)
Albumin: 4.1 g/dL (ref 3.8–4.9)
Alkaline Phosphatase: 75 IU/L (ref 44–121)
BUN/Creatinine Ratio: 17 (ref 9–20)
BUN: 12 mg/dL (ref 6–24)
Bilirubin Total: 0.4 mg/dL (ref 0.0–1.2)
CO2: 20 mmol/L (ref 20–29)
Calcium: 9.3 mg/dL (ref 8.7–10.2)
Chloride: 107 mmol/L — ABNORMAL HIGH (ref 96–106)
Creatinine, Ser: 0.72 mg/dL — ABNORMAL LOW (ref 0.76–1.27)
Globulin, Total: 2.5 g/dL (ref 1.5–4.5)
Glucose: 110 mg/dL — ABNORMAL HIGH (ref 70–99)
Potassium: 3.6 mmol/L (ref 3.5–5.2)
Sodium: 142 mmol/L (ref 134–144)
Total Protein: 6.6 g/dL (ref 6.0–8.5)
eGFR: 105 mL/min/{1.73_m2} (ref >59)

## 2023-08-02 LAB — LIPID PANEL
Chol/HDL Ratio: 2.7 ratio (ref 0.0–5.0)
Cholesterol, Total: 133 mg/dL (ref 100–199)
HDL: 49 mg/dL (ref >39)
LDL Chol Calc (NIH): 70 mg/dL (ref 0–99)
Triglycerides: 69 mg/dL (ref 0–149)
VLDL Cholesterol Cal: 14 mg/dL (ref 5–40)

## 2023-08-02 LAB — THYROID PANEL WITH TSH
Free Thyroxine Index: 1.1 — ABNORMAL LOW (ref 1.2–4.9)
T3 Uptake Ratio: 24 % (ref 24–39)
T4, Total: 4.6 ug/dL (ref 4.5–12.0)
TSH: 7.89 u[IU]/mL — ABNORMAL HIGH (ref 0.450–4.500)

## 2023-08-13 DIAGNOSIS — Z419 Encounter for procedure for purposes other than remedying health state, unspecified: Secondary | ICD-10-CM | POA: Diagnosis not present

## 2023-08-25 ENCOUNTER — Telehealth: Payer: Self-pay | Admitting: Neurology

## 2023-08-25 NOTE — Telephone Encounter (Signed)
 LVM and sent mychart msg informing pt of need to reschedule 11/21/23 appt - MD out

## 2023-08-30 ENCOUNTER — Ambulatory Visit: Attending: Nurse Practitioner

## 2023-08-30 ENCOUNTER — Telehealth: Payer: Self-pay

## 2023-08-30 DIAGNOSIS — E039 Hypothyroidism, unspecified: Secondary | ICD-10-CM

## 2023-08-30 NOTE — Telephone Encounter (Signed)
Return call unanswered.  

## 2023-08-30 NOTE — Telephone Encounter (Signed)
 Patient came in for labs he is requesting for someone to call him back regarding labs from 6/30 to re-explain them. And he expressed some concerns about his medications that were sent into [facility] on [facility] patient states he has been there 3 times and they keep telling him that he has no prescriptions to pick up.

## 2023-08-31 LAB — THYROID PANEL WITH TSH
Free Thyroxine Index: 0.9 — ABNORMAL LOW (ref 1.2–4.9)
T3 Uptake Ratio: 21 % — ABNORMAL LOW (ref 24–39)
T4, Total: 4.1 ug/dL — ABNORMAL LOW (ref 4.5–12.0)
TSH: 10.4 u[IU]/mL — ABNORMAL HIGH (ref 0.450–4.500)

## 2023-09-06 ENCOUNTER — Other Ambulatory Visit: Payer: Self-pay | Admitting: Nurse Practitioner

## 2023-09-06 ENCOUNTER — Ambulatory Visit: Payer: Self-pay | Admitting: Nurse Practitioner

## 2023-09-06 DIAGNOSIS — R7989 Other specified abnormal findings of blood chemistry: Secondary | ICD-10-CM

## 2023-09-06 MED ORDER — LEVOTHYROXINE SODIUM 88 MCG PO TABS: 88.0000 ug | ORAL_TABLET | Freq: Every day | ORAL | 0 refills | Status: DC

## 2023-09-13 DIAGNOSIS — Z419 Encounter for procedure for purposes other than remedying health state, unspecified: Secondary | ICD-10-CM | POA: Diagnosis not present

## 2023-10-05 ENCOUNTER — Ambulatory Visit: Attending: Nurse Practitioner

## 2023-10-05 DIAGNOSIS — R7989 Other specified abnormal findings of blood chemistry: Secondary | ICD-10-CM

## 2023-10-06 LAB — THYROID PANEL WITH TSH
Free Thyroxine Index: 1.5 (ref 1.2–4.9)
T3 Uptake Ratio: 23 % — ABNORMAL LOW (ref 24–39)
T4, Total: 6.4 ug/dL (ref 4.5–12.0)
TSH: 5.43 u[IU]/mL — ABNORMAL HIGH (ref 0.450–4.500)

## 2023-10-09 ENCOUNTER — Ambulatory Visit: Payer: Self-pay | Admitting: Nurse Practitioner

## 2023-10-11 ENCOUNTER — Telehealth: Payer: Self-pay

## 2023-10-11 NOTE — Telephone Encounter (Signed)
 Spoke with patient advised per PCPThyroid levels improved.  Continue current dose of thyroid  medication.  This medication should be taken around the same time every morning by itself. Patient expressed understanding.

## 2023-10-14 DIAGNOSIS — Z419 Encounter for procedure for purposes other than remedying health state, unspecified: Secondary | ICD-10-CM | POA: Diagnosis not present

## 2023-11-10 ENCOUNTER — Ambulatory Visit: Admitting: Orthopedic Surgery

## 2023-11-10 ENCOUNTER — Other Ambulatory Visit: Payer: Self-pay

## 2023-11-10 DIAGNOSIS — M542 Cervicalgia: Secondary | ICD-10-CM

## 2023-11-10 MED ORDER — GABAPENTIN 300 MG PO CAPS: 300.0000 mg | ORAL_CAPSULE | Freq: Three times a day (TID) | ORAL | 2 refills | Status: DC

## 2023-11-10 NOTE — Progress Notes (Signed)
 Orthopedic Spine Surgery Office Note   Assessment: Patient is a 58 y.o. male with neck pain that radiates into bilateral shoulders. 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 No symptoms or signs of myelopathy     Plan: -Patient has tried tylenol , ibuprofen , flexeril , gabapentin , medrol  dose pak, cervical ESI - Sent in a new prescription for gabapentin  -Patient did not get any relief with the cervical ESI so would not try again in the future -Patient should return to office on an as-needed basis     Patient expressed understanding of the plan and all questions were answered to the patient's satisfaction.    ___________________________________________________________________________     History:   Patient is a 58 y.o. male who comes in to talk about neck pain today.  Patient continues to have neck pain.  He feels it going into his bilateral shoulders.  It goes to the lateral aspect of his shoulder.  It does not radiate past that point.  The majority the pain is in the neck.  I had previously prescribed him gabapentin  and he found that helpful.  He has run out of that medication and was interested in a refill.  He has not developed any new symptoms since he was last seen in the office. Continues to use a muscle relaxer as well.    Treatments tried: Tylenol , ibuprofen , Flexeril , medrol  dose pak, gabapentin , cervical ESI     Physical Exam:   General: no acute distress, appears stated age Neurologic: alert, answering questions appropriately, following commands Respiratory: unlabored breathing on room air, symmetric chest rise Psychiatric: appropriate affect, normal cadence to speech     MSK (spine):   -Strength exam                                                   Left                  Right Grip strength                5/5                  5/5 Interosseus                  5/5                  5/5 Wrist extension            5/5                   5/5 Wrist flexion                 5/5                  5/5 Elbow flexion                4+/5                4+/5 Deltoid                          4+/5                4+/5   -Sensory exam  Sensation intact to light touch in C5-T1 nerve distributions of bilateral upper extremities  -Negative hoffman bilaterally -Negative grip and release test -No interosseous muscle wasting seen   Imaging: XRs of the cervical spine from 11/10/2023 were independently reviewed and interpreted, showing disc height loss at C2/3 and C6/7.  No other significant degenerative changes seen.  No evidence of instability on flexion/extension views.  No fracture or dislocation seen.   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  Central stenosis with left-sided foraminal stenosis at C4/5.  Central stenosis at C6/7 with right-sided foraminal stenosis.  No other stenosis seen.  No T2 cord signal change seen.     Patient name: Jonathan Lyons Patient MRN: [id number] Date of visit: 11/10/23

## 2023-11-21 ENCOUNTER — Ambulatory Visit: Payer: Self-pay | Admitting: Neurology

## 2023-12-03 ENCOUNTER — Other Ambulatory Visit: Payer: Self-pay | Admitting: Nurse Practitioner

## 2023-12-03 DIAGNOSIS — R7989 Other specified abnormal findings of blood chemistry: Secondary | ICD-10-CM

## 2023-12-05 ENCOUNTER — Encounter: Payer: Self-pay | Admitting: Radiology

## 2023-12-28 ENCOUNTER — Ambulatory Visit: Admitting: Physician Assistant

## 2023-12-28 ENCOUNTER — Encounter: Payer: Self-pay | Admitting: Physician Assistant

## 2023-12-28 VITALS — BP 142/89 | HR 50 | Ht 73.0 in | Wt 189.0 lb

## 2023-12-28 DIAGNOSIS — I10 Essential (primary) hypertension: Secondary | ICD-10-CM

## 2023-12-28 DIAGNOSIS — E039 Hypothyroidism, unspecified: Secondary | ICD-10-CM | POA: Insufficient documentation

## 2023-12-28 DIAGNOSIS — R519 Headache, unspecified: Secondary | ICD-10-CM

## 2023-12-28 DIAGNOSIS — M5412 Radiculopathy, cervical region: Secondary | ICD-10-CM | POA: Insufficient documentation

## 2023-12-28 DIAGNOSIS — Z79899 Other long term (current) drug therapy: Secondary | ICD-10-CM | POA: Diagnosis not present

## 2023-12-28 DIAGNOSIS — Z79890 Hormone replacement therapy (postmenopausal): Secondary | ICD-10-CM | POA: Diagnosis not present

## 2023-12-28 DIAGNOSIS — G4700 Insomnia, unspecified: Secondary | ICD-10-CM

## 2023-12-28 MED ORDER — CYCLOBENZAPRINE HCL 5 MG PO TABS: ORAL_TABLET | ORAL | 1 refills | Status: DC
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
## 2023-12-28 NOTE — Patient Instructions (Addendum)
 VISIT SUMMARY:  Today, we discussed your ongoing neck pain, numbness, headaches, thyroid  function, and blood pressure. We reviewed your current medications and made some adjustments to help manage your symptoms more effectively.  YOUR PLAN:  -CERVICAL RADICULOPATHY WITH CERVICAL SPINAL STENOSIS: Cervical radiculopathy with cervical spinal stenosis means that the nerves in your neck are being compressed, causing pain and numbness. We checked your kidney function to ensure you can tolerate your current medication, gabapentin . We increased your Flexeril  dosage to 10 mg, 1-2 tablets every 6 hours as needed for better pain relief. Avoid sleeping on the side that worsens your symptoms. If your symptoms persist, we may consider a nerve study.  -HEADACHE: Your headaches may be related to your neck issues, lack of sleep, or thyroid  dysfunction. We will check your thyroid  function and vitamin D levels. Improving your sleep habits and considering over-the-counter melatonin 5 mg may help. Please monitor your blood pressure at home and record the readings.  -HYPOTHYROIDISM: Hypothyroidism means your thyroid  gland is not producing enough hormones, which can affect your overall health. We rechecked your thyroid  function, including TSH levels, to ensure your medication is working properly.  -HYPERTENSION: Hypertension means you have high blood pressure, which can contribute to headaches and other health issues. Please monitor your blood pressure daily and record the readings. We will reassess your blood pressure management at your next visit.  How to Take Your Blood Pressure Blood pressure is a measurement of how strongly your blood is pressing against the walls of your arteries. Arteries are blood vessels that carry blood from your heart throughout your body. Your health care provider takes your blood pressure at each office visit. You can also take your own blood pressure at home with a blood pressure monitor. You  may need to take your own blood pressure to: Confirm a diagnosis of high blood pressure (hypertension). Monitor your blood pressure over time. Make sure your blood pressure medicine is working. Supplies needed: Blood pressure monitor. A chair to sit in. This should be a chair where you can sit upright with your back supported. Do not sit on a soft couch or an armchair. Table or desk. Small notebook and pencil or pen. How to prepare To get the most accurate reading, avoid the following for 30 minutes before you check your blood pressure: Drinking caffeine. Drinking alcohol. Eating. Smoking. Exercising. Five minutes before you check your blood pressure: Use the bathroom and urinate so that you have an empty bladder. Sit quietly in a chair. Do not talk. How to take your blood pressure To check your blood pressure, follow the instructions in the manual that came with your blood pressure monitor. If you have a digital blood pressure monitor, the instructions may be as follows: Sit up straight in a chair. Place your feet on the floor. Do not cross your ankles or legs. Rest your left arm at the level of your heart on a table or desk or on the arm of a chair. Pull up your shirt sleeve. Wrap the blood pressure cuff around the upper part of your left arm, 1 inch (2.5 cm) above your elbow. It is best to wrap the cuff around bare skin. Fit the cuff snugly, but not too tightly, around your arm. You should be able to place only one finger between the cuff and your arm. Position the cord so that it rests in the bend of your elbow. Press the power button. Sit quietly while the cuff inflates and deflates. Read  the digital reading on the monitor screen and write the numbers down (record them) in a notebook. Wait 2-3 minutes, then repeat the steps, starting at step 1. What does my blood pressure reading mean? A blood pressure reading consists of a higher number over a lower number. Ideally, your blood  pressure should be below 120/80. The first (top) number is called the systolic pressure. It is a measure of the pressure in your arteries as your heart beats. The second (bottom) number is called the diastolic pressure. It is a measure of the pressure in your arteries as the heart relaxes. Blood pressure is classified into four stages. The following are the stages for adults who do not have a short-term serious illness or a chronic condition. Systolic pressure and diastolic pressure are measured in a unit called mm Hg (millimeters of mercury).  Normal Systolic pressure: below 120. Diastolic pressure: below 80. Elevated Systolic pressure: 120-129. Diastolic pressure: below 80. Hypertension stage 1 Systolic pressure: 130-139. Diastolic pressure: 80-89. Hypertension stage 2 Systolic pressure: 140 or above. Diastolic pressure: 90 or above. You can have elevated blood pressure or hypertension even if only the systolic or only the diastolic number in your reading is higher than normal. Follow these instructions at home: Medicines Take over-the-counter and prescription medicines only as told by your health care provider. Tell your health care provider if you are having any side effects from blood pressure medicine. General instructions Check your blood pressure as often as recommended by your health care provider. Check your blood pressure at the same time every day. Take your monitor to the next appointment with your health care provider to make sure that: You are using it correctly. It provides accurate readings. Understand what your goal blood pressure numbers are. Keep all follow-up visits. This is important. General tips Your health care provider can suggest a reliable monitor that will meet your needs. There are several types of home blood pressure monitors. Choose a monitor that has an arm cuff. Do not choose a monitor that measures your blood pressure from your wrist or  finger. Choose a cuff that wraps snugly, not too tight or too loose, around your upper arm. You should be able to fit only one finger between your arm and the cuff. You can buy a blood pressure monitor at most drugstores or online. Where to find more information American Heart Association: www.heart.org Contact a health care provider if: Your blood pressure is consistently high. Your blood pressure is suddenly low. Get help right away if: Your systolic blood pressure is higher than 180. Your diastolic blood pressure is higher than 120. These symptoms may be an emergency. Get help right away. Call 911. Do not wait to see if the symptoms will go away. Do not drive yourself to the hospital. Summary Blood pressure is a measurement of how strongly your blood is pressing against the walls of your arteries. A blood pressure reading consists of a higher number over a lower number. Ideally, your blood pressure should be below 120/80. Check your blood pressure at the same time every day. Avoid caffeine, alcohol, smoking, and exercise for 30 minutes prior to checking your blood pressure. These agents can affect the accuracy of the blood pressure reading. This information is not intended to replace advice given to you by your health care provider. Make sure you discuss any questions you have with your health care provider. Document Revised: 10/02/2020 Document Reviewed: 10/02/2020 Elsevier Patient Education  2024 [employer].

## 2023-12-30 LAB — THYROID PANEL WITH TSH
Free Thyroxine Index: 0.9 — ABNORMAL LOW (ref 1.2–4.9)
T3 Uptake Ratio: 21 % — ABNORMAL LOW (ref 24–39)
T4, Total: 4.5 ug/dL (ref 4.5–12.0)
TSH: 13.6 u[IU]/mL — ABNORMAL HIGH (ref 0.450–4.500)

## 2023-12-30 LAB — BASIC METABOLIC PANEL WITH GFR
BUN/Creatinine Ratio: 23 — ABNORMAL HIGH (ref 9–20)
BUN: 20 mg/dL (ref 6–24)
CO2: 22 mmol/L (ref 20–29)
Calcium: 10 mg/dL (ref 8.7–10.2)
Chloride: 104 mmol/L (ref 96–106)
Creatinine, Ser: 0.88 mg/dL (ref 0.76–1.27)
Glucose: 81 mg/dL (ref 70–99)
Potassium: 4.4 mmol/L (ref 3.5–5.2)
Sodium: 141 mmol/L (ref 134–144)
eGFR: 99 mL/min/1.73 (ref >59)

## 2023-12-30 LAB — VITAMIN D 25 HYDROXY (VIT D DEFICIENCY, FRACTURES): Vit D, 25-Hydroxy: 7.2 ng/mL — ABNORMAL LOW (ref 30.0–100.0)

## 2024-01-02 ENCOUNTER — Ambulatory Visit: Payer: Self-pay | Admitting: Physician Assistant

## 2024-01-02 DIAGNOSIS — R7989 Other specified abnormal findings of blood chemistry: Secondary | ICD-10-CM

## 2024-01-02 DIAGNOSIS — E559 Vitamin D deficiency, unspecified: Secondary | ICD-10-CM

## 2024-01-02 DIAGNOSIS — E039 Hypothyroidism, unspecified: Secondary | ICD-10-CM

## 2024-01-02 MED ORDER — VITAMIN D (ERGOCALCIFEROL) 1.25 MG (50000 UNIT) PO CAPS: 50000.0000 [IU] | ORAL_CAPSULE | ORAL | 2 refills | Status: AC

## 2024-01-02 MED ORDER — LEVOTHYROXINE SODIUM 100 MCG PO TABS: 100.0000 ug | ORAL_TABLET | Freq: Every day | ORAL | 1 refills | Status: AC

## 2024-01-09 ENCOUNTER — Telehealth: Payer: Self-pay | Admitting: Physician Assistant

## 2024-01-09 NOTE — Telephone Encounter (Signed)
 Pt scheduled follow up visit 01/10/24 at 220 pm

## 2024-01-10 ENCOUNTER — Ambulatory Visit: Admitting: Physician Assistant

## 2024-01-10 ENCOUNTER — Encounter: Payer: Self-pay | Admitting: Physician Assistant

## 2024-01-10 VITALS — BP 158/83 | HR 57

## 2024-01-10 DIAGNOSIS — M5412 Radiculopathy, cervical region: Secondary | ICD-10-CM

## 2024-01-10 DIAGNOSIS — E039 Hypothyroidism, unspecified: Secondary | ICD-10-CM | POA: Diagnosis not present

## 2024-01-10 DIAGNOSIS — I10 Essential (primary) hypertension: Secondary | ICD-10-CM

## 2024-01-10 DIAGNOSIS — E559 Vitamin D deficiency, unspecified: Secondary | ICD-10-CM | POA: Diagnosis not present

## 2024-01-10 DIAGNOSIS — Z79899 Other long term (current) drug therapy: Secondary | ICD-10-CM | POA: Diagnosis not present

## 2024-01-10 MED ORDER — CYCLOBENZAPRINE HCL 10 MG PO TABS: 10.0000 mg | ORAL_TABLET | Freq: Three times a day (TID) | ORAL | 1 refills | Status: AC | PRN

## 2024-01-10 MED ORDER — AMLODIPINE BESYLATE 10 MG PO TABS: 10.0000 mg | ORAL_TABLET | Freq: Every day | ORAL | 1 refills | Status: AC

## 2024-01-10 MED ORDER — VALSARTAN 160 MG PO TABS: 160.0000 mg | ORAL_TABLET | Freq: Every day | ORAL | 1 refills | Status: AC

## 2024-01-10 MED ORDER — GABAPENTIN 400 MG PO CAPS: 400.0000 mg | ORAL_CAPSULE | Freq: Three times a day (TID) | ORAL | 2 refills | Status: AC

## 2024-01-10 NOTE — Progress Notes (Signed)
 Established Patient Office Visit  Subjective   Patient ID: Jonathan Lyons, male    DOB: December 18, 1965  Age: 58 y.o. MRN: [id number]  Chief Complaint  Patient presents with   Medical Management of Chronic Issues  Discussed the use of AI scribe software for clinical note transcription with the patient, who gave verbal consent to proceed.  History of Present Illness   Jonathan Lyons is a 58 year old male with neck pain and hypertension who presents for medication management.  He notes improvement in neck pain on Flexeril  with the increased dose, though pain persists. He has been taking it regularly.  He takes gabapentin  300 mg three times daily. There was discussion about increasing the dose to reduce muscle relaxer use.  He is unsure which specific medication he is taking now and is unclear about his current regimen, though he believes he should be on both valsartan  and amlodipine . But states that he has only been taking one pill, and is unsure of which one it is.   His thyroid  medication was increased less than two weeks ago after an abnormal thyroid  level.  He is taking vitamin D  once a week for very low levels, previously measured at 7.      Past Medical History:  Diagnosis Date   Hypertension    Social History   Socioeconomic History   Marital status: Married    Spouse name: Not on file   Number of children: 5   Years of education: Not on file   Highest education level: Not on file  Occupational History   Occupation: Photographer  Tobacco Use   Smoking status: Never   Smokeless tobacco: Never  Vaping Use   Vaping status: Never Used  Substance and Sexual Activity   Alcohol use: No   Drug use: No   Sexual activity: Not Currently  Other Topics Concern   Not on file  Social History Narrative   ** Merged History Encounter **       Separated - 5 grown children 1 daughter 4 sons Company secretary  No EtOH, tobacco or drugs Emigrated from [place] decades ahgo    Social Drivers of Corporate Investment Banker Strain: Not on file  Food Insecurity: No Food Insecurity (12/28/2023)   Hunger Vital Sign    Worried About Running Out of Food in the Last Year: Never true    Ran Out of Food in the Last Year: Never true  Transportation Needs: No Transportation Needs (12/28/2023)   PRAPARE - Administrator, Civil Service (Medical): No    Lack of Transportation (Non-Medical): No  Physical Activity: Not on file  Stress: Not on file  Social Connections: Not on file  Intimate Partner Violence: Not At Risk (12/28/2023)   Humiliation, Afraid, Rape, and Kick questionnaire    Fear of Current or Ex-Partner: No    Emotionally Abused: No    Physically Abused: No    Sexually Abused: No   Family History  Problem Relation Age of Onset   Hypertension Neg Hx    Diabetes Neg Hx    Cancer Neg Hx    Colon cancer Neg Hx    Colon polyps Neg Hx    Esophageal cancer Neg Hx    Rectal cancer Neg Hx    Stomach cancer Neg Hx    No Known Allergies  Review of Systems  Constitutional: Negative.   HENT: Negative.    Eyes: Negative.   Respiratory:  Negative for shortness  of breath.   Cardiovascular:  Negative for chest pain.  Gastrointestinal: Negative.   Genitourinary: Negative.   Musculoskeletal:  Positive for neck pain.  Skin: Negative.   Neurological: Negative.   Endo/Heme/Allergies: Negative.   Psychiatric/Behavioral: Negative.        Objective:     BP (!) 158/83 (BP Location: Left Arm, Patient Position: Sitting, Cuff Size: Normal)   Pulse (!) 57   SpO2 99%  BP Readings from Last 3 Encounters:  01/10/24 (!) 158/83  12/28/23 (!) 142/89  08/01/23 122/64   Wt Readings from Last 3 Encounters:  12/28/23 189 lb (85.7 kg)  08/01/23 185 lb 3.2 oz (84 kg)  07/18/23 187 lb 6.3 oz (85 kg)    Physical Exam Vitals and nursing note reviewed.    GENERAL: Alert, cooperative, well developed, no acute distress HEENT: Normocephalic, normal  oropharynx, moist mucous membranes CHEST: Clear to auscultation bilaterally, No wheezes, rhonchi, or crackles CARDIOVASCULAR: Normal heart rate and rhythm, S1 and S2 normal without murmurs EXTREMITIES: No cyanosis or edema NEUROLOGICAL: Cranial nerves grossly intact, Moves all extremities without gross motor or sensory deficit     Assessment & Plan:   Problem List Items Addressed This Visit       Nervous and Auditory   Cervical radiculopathy   Relevant Medications   gabapentin  (NEURONTIN ) 400 MG capsule   cyclobenzaprine  (FLEXERIL ) 10 MG tablet   Other Visit Diagnoses       Primary hypertension       Relevant Medications   valsartan  (DIOVAN ) 160 MG tablet   amLODipine  (NORVASC ) 10 MG tablet      Assessment and Plan Cervical radiculopathy Chronic neck pain improved with Flexeril . Further improvement possible. - Increased gabapentin  to 400 mg three times a day. - Refilled Flexeril  at 10 mg every eight hours as needed.  Primary hypertension Hypertension managed with valsartan  and amlodipine . - Continue valsartan  and amlodipine  as prescribed. Patient encouraged to continue checking BP at home, keep log and have available for all office visits.  The patient was given clear instructions to go to ER or return to medical center if symptoms don't improve, worsen or new problems develop. The patient verbalized understanding.   Hypothyroidism Recent abnormal thyroid  function tests. New thyroid  medication dose started. - Will recheck thyroid  function in six weeks.  Vitamin D  deficiency Severe deficiency with level of 7. On weekly supplementation. - Continue weekly vitamin D  supplementation for 12 weeks. - Will recheck vitamin D  levels after 12 weeks.   I have reviewed the patient's medical history (PMH, PSH, Social History, Family History, Medications, and allergies) , and have been updated if relevant. I spent 30 minutes reviewing chart and  face to face time with  patient.    Return in about 3 weeks (around 01/31/2024) for [facility].    Kirk RAMAN Mayers, PA-C

## 2024-01-10 NOTE — Patient Instructions (Addendum)
 VISIT SUMMARY:  Today, you were seen for management of your neck pain and hypertension. We discussed your current medications and made some adjustments to help improve your symptoms.  YOUR PLAN:  -CERVICAL RADICULOPATHY: Cervical radiculopathy is a condition where a nerve in the neck is pinched, causing pain. Your chronic neck pain has improved with Flexeril , and we hope for further improvement. We have increased your gabapentin  dose to 400 mg three times a day and refilled your Flexeril  at 10 mg every eight hours as needed.  -PRIMARY HYPERTENSION: Hypertension, or high blood pressure, is being managed with valsartan  and amlodipine . Please continue taking these medications as prescribed.  -HYPOTHYROIDISM: Hypothyroidism is a condition where your thyroid  gland does not produce enough hormones. Your recent thyroid  function tests were abnormal, and we started you on a new thyroid  medication dose. We will recheck your thyroid  function in six weeks.  -VITAMIN D  DEFICIENCY: Vitamin D  deficiency means you have very low levels of vitamin D . You are on weekly supplementation, and we will continue this for 12 weeks. We will recheck your vitamin D  levels after 12 weeks.  Vitamin D  Deficiency: What to Know Vitamin D  deficiency is when your body doesn't have enough vitamin D . Vitamin D  is important because: It helps the body to maintain calcium and phosphorus levels. It helps to keep your bones healthy. Not getting enough vitamin D  can make your bones soft. It reduces irritation or inflammation in the body. It helps the body's defense system (immune system) work better. What are the causes? Not eating enough foods that have vitamin D  in them. Not getting enough sun. Having diseases that make it hard for your body to take in vitamin D . Having had part of your stomach or part of your small intestine taken out. What increases the risk? Being an older adult. Not spending much time outdoors. Living in a  long-term care center. Having dark skin. Taking certain medicines, such as steroids or seizure medicines. Being overweight or very overweight (obese). Having long-term (chronic) kidney or liver disease. What are the signs or symptoms? Pain in the bones. Pain in the muscles. Not being able to walk normally. Bones that break easily. Joint pain. How is this diagnosed? This condition may be diagnosed with blood tests. Imaging tests such as X-rays may also be done to look for weakness in the bone. How is this treated? Treatment may include taking supplements. You may be told to take vitamin D  or calcium.  Your health care provider will tell you what dose is best for you. Follow these instructions at home: Eating and drinking Eat foods that contain vitamin D , such as: Dairy products, cereals, or juices that have vitamin D  added to them. Check the label on the package to see if vitamin D  was added to your food. Fish, such as salmon or trout. Eggs. The vitamin D  is in the yolk. Mushrooms that were treated with UV light. Beef liver. The items listed above may not be all the foods and drinks that have vitamin D . Talk with a dietitian to learn more. General instructions Take your medicines only as told. Take supplements only as told. Get regular, safe exposure to natural sunlight. Do not use a tanning bed. Maintain a healthy weight. Lose weight if needed. Contact a health care provider if: Your symptoms do not go away. You throw up or feel like you may throw up. You have trouble pooping (constipation), or you poop less than usual. This information is not intended  to replace advice given to you by your health care provider. Make sure you discuss any questions you have with your health care provider. Document Revised: 06/06/2023 Document Reviewed: 04/08/2023 Elsevier Patient Education  2025 [employer].

## 2024-01-30 ENCOUNTER — Telehealth: Payer: Self-pay | Admitting: Nurse Practitioner

## 2024-01-30 NOTE — Telephone Encounter (Signed)
 Pt unconfirmed appt mailbox not set up to leave a message to confirmed appt

## 2024-01-31 ENCOUNTER — Ambulatory Visit: Admitting: Nurse Practitioner

## 2024-02-23 ENCOUNTER — Telehealth: Payer: Self-pay | Admitting: Nurse Practitioner

## 2024-02-23 NOTE — Telephone Encounter (Signed)
 Noted.

## 2024-02-23 NOTE — Telephone Encounter (Signed)
 Patient came in the office today and dropped off paperwork for [facility]. Paperwork placed in provider's box for completion.

## 2024-02-27 ENCOUNTER — Ambulatory Visit: Admitting: Nurse Practitioner

## 2024-02-29 ENCOUNTER — Ambulatory Visit: Payer: Self-pay | Admitting: Nurse Practitioner

## 2024-03-02 ENCOUNTER — Encounter: Payer: Self-pay | Admitting: Nurse Practitioner

## 2024-03-02 ENCOUNTER — Ambulatory Visit: Payer: Self-pay | Attending: Nurse Practitioner | Admitting: Nurse Practitioner

## 2024-03-02 VITALS — BP 134/76 | HR 68 | Ht 73.0 in | Wt 188.6 lb

## 2024-03-02 DIAGNOSIS — R937 Abnormal findings on diagnostic imaging of other parts of musculoskeletal system: Secondary | ICD-10-CM

## 2024-03-02 DIAGNOSIS — M5412 Radiculopathy, cervical region: Secondary | ICD-10-CM

## 2024-03-02 NOTE — Progress Notes (Signed)
 "  Assessment & Plan:  Jonathan Lyons was seen today for paperwork.  Diagnoses and all orders for this visit:  Cervical radiculopathy Paperwork for [facility] completed and returned to Jonathan Lyons.    Abnormal MRI, cervical spine Paperwork for [facility] completed and returned to Jonathan Lyons.    Jonathan Lyons has been counseled on age-appropriate routine health concerns for screening and prevention. These are reviewed and up-to-date. Referrals have been placed accordingly. Immunizations are up-to-date or declined.    Subjective:   Chief Complaint  Jonathan Lyons presents with   Paperwork    Jonathan Lyons 59 y.o. male presents to office today for paperwork completion in order to prevent his food stamps from being stopped.   Jonathan Lyons has a history of cervical radiculopathy for which Jonathan Lyons takes gabapentin  and is being followed by ortho. Jonathan Lyons has also received ESI in the past which were not very effective.       Review of Systems  Constitutional:  Negative for fever, malaise/fatigue and weight loss.  HENT: Negative.  Negative for nosebleeds.   Eyes: Negative.  Negative for blurred vision, double vision and photophobia.  Respiratory: Negative.  Negative for cough and shortness of breath.   Cardiovascular: Negative.  Negative for chest pain, palpitations and leg swelling.  Gastrointestinal: Negative.  Negative for heartburn, nausea and vomiting.  Musculoskeletal:  Positive for myalgias and neck pain.  Neurological: Negative.  Negative for dizziness, focal weakness, seizures and headaches.  Psychiatric/Behavioral: Negative.  Negative for suicidal ideas.     Past Medical History:  Diagnosis Date   Hypertension     Past Surgical History:  Procedure Laterality Date   NO PAST SURGERIES      Family History  Problem Relation Age of Onset   Hypertension Neg Hx    Diabetes Neg Hx    Cancer Neg Hx    Colon cancer Neg Hx    Colon polyps Neg Hx    Esophageal cancer Neg Hx    Rectal cancer Neg Hx    Stomach cancer Neg  Hx     Social History Reviewed with no changes to be made today.   Outpatient Medications Prior to Visit  Medication Sig Dispense Refill   amLODipine  (NORVASC ) 10 MG tablet Take 1 tablet (10 mg total) by mouth daily. 90 tablet 1   cyclobenzaprine  (FLEXERIL ) 10 MG tablet Take 1 tablet (10 mg total) by mouth 3 (three) times daily as needed for muscle spasms. 90 tablet 1   diclofenac  Sodium (VOLTAREN ) 1 % GEL Apply 2 g topically 4 (four) times daily. To back of neck 200 g 3   gabapentin  (NEURONTIN ) 400 MG capsule Take 1 capsule (400 mg total) by mouth 3 (three) times daily. 90 capsule 2   levothyroxine  (SYNTHROID ) 100 MCG tablet Take 1 tablet (100 mcg total) by mouth daily. 30 tablet 1   valsartan  (DIOVAN ) 160 MG tablet Take 1 tablet (160 mg total) by mouth daily. For blood pressure 90 tablet 1   Vitamin D , Ergocalciferol , (DRISDOL ) 1.25 MG (50000 UNIT) CAPS capsule Take 1 capsule (50,000 Units total) by mouth every 7 (seven) days. 4 capsule 2   No facility-administered medications prior to visit.    Allergies[1]     Objective:    BP 134/76 (BP Location: Left Arm, Jonathan Lyons Position: Sitting, Cuff Size: Normal)   Pulse 68   Ht 6' 1 (1.854 m)   Wt 188 lb 9.6 oz (85.5 kg)   SpO2 100%   BMI 24.88 kg/m  Wt Readings from Last 3 Encounters:  03/02/24 188 lb 9.6 oz (85.5 kg)  12/28/23 189 lb (85.7 kg)  08/01/23 185 lb 3.2 oz (84 kg)    Physical Exam Vitals and nursing note reviewed.  Constitutional:      Appearance: Jonathan Lyons is well-developed.  HENT:     Head: Normocephalic and atraumatic.  Cardiovascular:     Rate and Rhythm: Normal rate and regular rhythm.     Heart sounds: Normal heart sounds. No murmur heard.    No friction rub. No gallop.  Pulmonary:     Effort: Pulmonary effort is normal. No tachypnea or respiratory distress.     Breath sounds: Normal breath sounds. No decreased breath sounds, wheezing, rhonchi or rales.  Chest:     Chest wall: No tenderness.  Musculoskeletal:         General: Normal range of motion.     Cervical back: Normal range of motion.  Skin:    General: Skin is warm and dry.  Neurological:     Mental Status: Jonathan Lyons is alert and oriented to person, place, and time.     Coordination: Coordination normal.  Psychiatric:        Behavior: Behavior normal. Behavior is cooperative.        Thought Content: Thought content normal.        Judgment: Judgment normal.          Jonathan Lyons has been counseled extensively about nutrition and exercise as well as the importance of adherence with medications and regular follow-up. The Jonathan Lyons was given clear instructions to go to ER or return to medical center if symptoms don't improve, worsen or new problems develop. The Jonathan Lyons verbalized understanding.   Follow-up: Return if symptoms worsen or fail to improve.   Haze LELON Servant, FNP-BC [facility] [place], [place] [phone]   03/02/2024, 11:51 AM    [1] No Known Allergies  "

## 2024-03-19 ENCOUNTER — Ambulatory Visit: Admitting: Nurse Practitioner
